# Patient Record
Sex: Male | Born: 1935 | Race: White | Hispanic: No | Marital: Married | Smoking: Former smoker
Health system: Southern US, Community
[De-identification: ages and names within clinical notes are randomized; demographics above are authoritative.]

## PROBLEM LIST (undated history)

## (undated) DIAGNOSIS — K648 Other hemorrhoids: Secondary | ICD-10-CM

## (undated) DIAGNOSIS — I4891 Unspecified atrial fibrillation: Secondary | ICD-10-CM

## (undated) DIAGNOSIS — D696 Thrombocytopenia, unspecified: Secondary | ICD-10-CM

## (undated) DIAGNOSIS — J449 Chronic obstructive pulmonary disease, unspecified: Secondary | ICD-10-CM

## (undated) DIAGNOSIS — N183 Chronic kidney disease, stage 3 unspecified: Secondary | ICD-10-CM

## (undated) DIAGNOSIS — N289 Disorder of kidney and ureter, unspecified: Secondary | ICD-10-CM

## (undated) DIAGNOSIS — K219 Gastro-esophageal reflux disease without esophagitis: Secondary | ICD-10-CM

## (undated) DIAGNOSIS — K222 Esophageal obstruction: Secondary | ICD-10-CM

## (undated) DIAGNOSIS — K709 Alcoholic liver disease, unspecified: Secondary | ICD-10-CM

## (undated) DIAGNOSIS — Z951 Presence of aortocoronary bypass graft: Secondary | ICD-10-CM

## (undated) DIAGNOSIS — I2510 Atherosclerotic heart disease of native coronary artery without angina pectoris: Secondary | ICD-10-CM

## (undated) DIAGNOSIS — K7200 Acute and subacute hepatic failure without coma: Secondary | ICD-10-CM

## (undated) DIAGNOSIS — N400 Enlarged prostate without lower urinary tract symptoms: Secondary | ICD-10-CM

## (undated) DIAGNOSIS — R188 Other ascites: Secondary | ICD-10-CM

## (undated) DIAGNOSIS — E039 Hypothyroidism, unspecified: Secondary | ICD-10-CM

## (undated) DIAGNOSIS — I10 Essential (primary) hypertension: Secondary | ICD-10-CM

## (undated) DIAGNOSIS — R944 Abnormal results of kidney function studies: Secondary | ICD-10-CM

## (undated) HISTORY — DX: Chronic kidney disease, stage 3 unspecified: N18.30

## (undated) HISTORY — DX: Chronic kidney disease, stage 3 (moderate): N18.3

## (undated) HISTORY — PX: BACK SURGERY: SHX140

## (undated) HISTORY — DX: Hypothyroidism, unspecified: E03.9

## (undated) HISTORY — PX: APPENDECTOMY: SHX54

## (undated) HISTORY — PX: CARDIAC SURGERY: SHX584

## (undated) HISTORY — DX: Gastro-esophageal reflux disease without esophagitis: K21.9

## (undated) HISTORY — DX: Other hemorrhoids: K64.8

## (undated) HISTORY — DX: Atherosclerotic heart disease of native coronary artery without angina pectoris: I25.10

## (undated) HISTORY — DX: Esophageal obstruction: K22.2

## (undated) HISTORY — DX: Abnormal results of kidney function studies: R94.4

## (undated) HISTORY — DX: Other ascites: R18.8

## (undated) HISTORY — DX: Benign prostatic hyperplasia without lower urinary tract symptoms: N40.0

---

## 2002-01-24 ENCOUNTER — Encounter: Admission: RE | Admit: 2002-01-24 | Discharge: 2002-01-24 | Payer: Self-pay | Admitting: Orthopedic Surgery

## 2002-01-24 ENCOUNTER — Encounter: Payer: Self-pay | Admitting: Orthopedic Surgery

## 2002-02-03 ENCOUNTER — Encounter: Admission: RE | Admit: 2002-02-03 | Discharge: 2002-02-03 | Payer: Self-pay | Admitting: Orthopedic Surgery

## 2002-02-03 ENCOUNTER — Encounter: Payer: Self-pay | Admitting: Orthopedic Surgery

## 2002-02-17 ENCOUNTER — Encounter: Payer: Self-pay | Admitting: Orthopedic Surgery

## 2002-02-17 ENCOUNTER — Encounter: Admission: RE | Admit: 2002-02-17 | Discharge: 2002-02-17 | Payer: Self-pay | Admitting: Orthopedic Surgery

## 2012-02-22 ENCOUNTER — Emergency Department (HOSPITAL_COMMUNITY): Payer: Medicare Other

## 2012-02-22 ENCOUNTER — Encounter (HOSPITAL_COMMUNITY): Payer: Self-pay | Admitting: Emergency Medicine

## 2012-02-22 ENCOUNTER — Emergency Department (HOSPITAL_COMMUNITY)
Admission: EM | Admit: 2012-02-22 | Discharge: 2012-02-23 | Disposition: A | Discharge status: Home / Self Care | Payer: Medicare Other | Attending: Emergency Medicine | Admitting: Emergency Medicine

## 2012-02-22 DIAGNOSIS — J449 Chronic obstructive pulmonary disease, unspecified: Secondary | ICD-10-CM | POA: Insufficient documentation

## 2012-02-22 DIAGNOSIS — I10 Essential (primary) hypertension: Secondary | ICD-10-CM | POA: Insufficient documentation

## 2012-02-22 DIAGNOSIS — W108XXA Fall (on) (from) other stairs and steps, initial encounter: Secondary | ICD-10-CM | POA: Insufficient documentation

## 2012-02-22 DIAGNOSIS — S2249XA Multiple fractures of ribs, unspecified side, initial encounter for closed fracture: Secondary | ICD-10-CM | POA: Insufficient documentation

## 2012-02-22 DIAGNOSIS — J4489 Other specified chronic obstructive pulmonary disease: Secondary | ICD-10-CM | POA: Insufficient documentation

## 2012-02-22 DIAGNOSIS — Y92009 Unspecified place in unspecified non-institutional (private) residence as the place of occurrence of the external cause: Secondary | ICD-10-CM | POA: Insufficient documentation

## 2012-02-22 DIAGNOSIS — Z951 Presence of aortocoronary bypass graft: Secondary | ICD-10-CM | POA: Insufficient documentation

## 2012-02-22 DIAGNOSIS — N289 Disorder of kidney and ureter, unspecified: Secondary | ICD-10-CM | POA: Insufficient documentation

## 2012-02-22 DIAGNOSIS — I4891 Unspecified atrial fibrillation: Secondary | ICD-10-CM | POA: Insufficient documentation

## 2012-02-22 DIAGNOSIS — I2510 Atherosclerotic heart disease of native coronary artery without angina pectoris: Secondary | ICD-10-CM | POA: Insufficient documentation

## 2012-02-22 HISTORY — DX: Essential (primary) hypertension: I10

## 2012-02-22 HISTORY — DX: Presence of aortocoronary bypass graft: Z95.1

## 2012-02-22 HISTORY — DX: Unspecified atrial fibrillation: I48.91

## 2012-02-22 HISTORY — DX: Chronic obstructive pulmonary disease, unspecified: J44.9

## 2012-02-22 HISTORY — DX: Disorder of kidney and ureter, unspecified: N28.9

## 2012-02-22 HISTORY — DX: Atherosclerotic heart disease of native coronary artery without angina pectoris: I25.10

## 2012-02-22 MED ORDER — HYDROCODONE-ACETAMINOPHEN 5-325 MG PO TABS: 1.0000 | ORAL_TABLET | ORAL | Status: AC | PRN

## 2012-02-22 MED ORDER — HYDROCODONE-ACETAMINOPHEN 5-325 MG PO TABS
1.0000 | ORAL_TABLET | Freq: Once | ORAL | Status: AC
  Administered 2012-02-22: 1 via ORAL
  Filled 2012-02-22: qty 1

## 2012-02-22 NOTE — ED Notes (Signed)
WUJ:[id number]<JY> Expected date:<BR> Expected time:<BR> Means of arrival:<BR> Comments:<BR> No bed

## 2012-02-22 NOTE — ED Notes (Signed)
Pt alert, arrives from home, c/o fall from standing, onset was this evening, states was helping wife up stairs ,fell, landing into car, c/o left rib pain, right knee pain, pt has abrasions to both knees, DSD intact

## 2012-02-22 NOTE — ED Provider Notes (Signed)
History     CSN: [id number]  Arrival date & time 02/22/12  2006   First MD Initiated Contact with Patient 02/22/12 2258      Chief Complaint  Patient presents with  . Fall  . Rib Injury   HPI  History provided by the patient and daughter. Patient is a 79 year old male with history of hypertension, COPD, CAD and A. Fib. Who presents with left-sided chest wall pain after a fall. Patient states that he and family were just returning from visiting [place] and were getting into the house. Patient was helping his wife 3 porch steps when she was losing her balance. Patient states that she has history of falls in the past and he was trying to brace her and keep her standing. They ended up stumbling back wards several steps and he states they both ended up falling. As they did patient hit the left side of his chest wall and back against the car in the driveway. He denies significant head injury or any LOC. Since that time he reports having left-sided chest wall pains. Pain is worse with deep breathing or some movements. He has not taken anything for his symptoms. He denies any pain to the extremities or back. Denies any numbness or weakness he denies feeling short of breath.    Past Medical History  Diagnosis Date  . S/P CABG x 5   . A-fib   . COPD (chronic obstructive pulmonary disease)   . Coronary artery disease   . Hypertension   . Renal disorder     Past Surgical History  Procedure Date  . Back surgery   . Cardiac surgery     No family history on file.  History  Substance Use Topics  . Smoking status: Never Smoker   . Smokeless tobacco: Not on file  . Alcohol Use: No      Review of Systems  HENT: Negative for neck pain.   Respiratory: Negative for shortness of breath.   Cardiovascular: Positive for chest pain.  Gastrointestinal: Negative for nausea, vomiting and abdominal pain.  Musculoskeletal: Negative for back pain.  Neurological: Negative for light-headedness  and headaches.    Allergies  Review of patient's allergies indicates no known allergies.  Home Medications  No current outpatient prescriptions on file.  BP 129/51  Pulse 60  Temp 98 F (36.7 C)  Resp 16  Wt 170 lb (77.111 kg)  SpO2 98%  Physical Exam  Nursing note and vitals reviewed. Constitutional: He is oriented to person, place, and time. He appears well-developed and well-nourished. No distress.  HENT:  Head: Normocephalic and atraumatic.       No battle sign or raccoon eyes  Neck: Normal range of motion. Neck supple.       No cervical midline tenderness  Cardiovascular: Normal rate.  An irregular rhythm present.  Pulmonary/Chest: Effort normal. He has no decreased breath sounds. He has no wheezes. He has no rhonchi. He has rales in the left lower field.    Abdominal: Soft. There is no tenderness. There is no rebound and no guarding.  Musculoskeletal: Normal range of motion. He exhibits no edema and no tenderness.  Neurological: He is alert and oriented to person, place, and time. He has normal strength. No sensory deficit. Gait normal.  Skin: Skin is warm. No erythema.  Psychiatric: His behavior is normal.    ED Course  Procedures   Dg Ribs Unilateral W/chest Left  02/22/2012  *RADIOLOGY REPORT*  Clinical Data: Fall.  Left rib pain.  LEFT RIBS AND CHEST - 3+ VIEW  Comparison: None.  Findings: Subsegmental atelectasis and a small left effusion are noted.  Right lung is clear.  There is cardiomegaly.  The patient status post CABG.  No pneumothorax.  There are fractures on the left eighth, ninth and tenth ribs which appear nondisplaced.  IMPRESSION: Nondisplaced left eighth through tenth rib fractures. Tiny left pleural effusion also noted.  Original Report Authenticated By: Bernadene Bell. D'ALESSIO, M.D.     1. Multiple rib fractures       MDM  10:50PM patient seen and evaluated. Patient no acute distress but uncomfortable.   Discuss the patient's findings on  x-ray and diagnosis. At this time patient requesting to return home. We'll provide prescription for pain medicines and incentive spirometer.  Pt discussed with Attending Physician.     Angus Seller, [place] 02/22/12 [phone]

## 2012-02-22 NOTE — ED Notes (Signed)
Pt states that he was helping his wife up the concrete stairs outside when she lost her balance. He fell trying to keep her from falling. Pt says he hit the car with his side before hitting the concrete. Pt has pain on left flank and left shoulder.

## 2012-02-23 NOTE — ED Provider Notes (Signed)
Medical screening examination/treatment/procedure(s) were performed by non-physician practitioner and as supervising physician I was immediately available for consultation/collaboration.  [name], MD 02/23/12 [phone]

## 2014-12-12 ENCOUNTER — Telehealth: Payer: Self-pay | Admitting: Cardiology

## 2014-12-12 NOTE — Telephone Encounter (Signed)
Patient signed Release to obtain records [facility]ocating to this area.  Faxed signed release to [facility].  Faxed 12/12/14.

## 2014-12-14 ENCOUNTER — Telehealth: Payer: Self-pay | Admitting: Cardiology

## 2014-12-14 NOTE — Telephone Encounter (Signed)
Received records that Dr Antoine Poche requested from [facility] [place].  Records give to Dr Antoine Poche for review.

## 2014-12-26 ENCOUNTER — Telehealth: Payer: Self-pay | Admitting: Cardiology

## 2014-12-26 NOTE — Telephone Encounter (Signed)
Received records from [facility] of [place] for appointment on 03/29/15.  Records given to [facility] (medical records) for Dr Hochrein's schedule on 03/29/15. lp

## 2015-03-29 ENCOUNTER — Ambulatory Visit (INDEPENDENT_AMBULATORY_CARE_PROVIDER_SITE_OTHER): Payer: Medicare Other | Admitting: Cardiology

## 2015-03-29 ENCOUNTER — Encounter: Payer: Self-pay | Admitting: Cardiology

## 2015-03-29 VITALS — BP 132/80 | HR 61 | Ht 69.0 in | Wt 173.7 lb

## 2015-03-29 DIAGNOSIS — I482 Chronic atrial fibrillation, unspecified: Secondary | ICD-10-CM | POA: Insufficient documentation

## 2015-03-29 DIAGNOSIS — I2510 Atherosclerotic heart disease of native coronary artery without angina pectoris: Secondary | ICD-10-CM | POA: Diagnosis not present

## 2015-03-29 NOTE — Progress Notes (Signed)
Cardiology Office Note   Date:  03/29/2015   ID:  CORRIE Taylor, DOB 1930-04-03, MRN [id number]  PCP:  Ginette Otto, MD  Cardiologist:   Rollene Rotunda, MD   Chief Complaint  Patient presents with  . Coronary Artery Disease      History of Present Illness: Xavier Taylor is a 81 y.o. male who presents for evaluation of coronary artery disease. He has moved up here from [place]. He had bypass actually at [facility] many years ago when he was visiting his daughter. At that time he was living in [place]. He has now moved here and is transferring his cardiology care. He has a history of bypass with cardiac catheterization last in 2002 as described below. His last stress test was in 2009 he had a negative perfusion study. Echocardiography in 2014 demonstrated a preserved ejection fraction. He has had atrial fibrillation with previous cardioversion but he has apparently been in chronic atrial fibrillation for a couple of years. He tolerates anticoagulation.  The patient lives at a nursing home with his wife. He walks to the dining hall which is about a football field distance away. He denies any cardiovascular symptoms. He has no chest pressure, neck or arm discomfort. He denies any shortness of breath, PND or orthopnea. He has no palpitations, presyncope or syncope. His biggest complaint has been neuropathy.  Past Medical History  Diagnosis Date  . S/P CABG x 5   . A-fib   . COPD (chronic obstructive pulmonary disease)   . Coronary artery disease   . Hypertension   . Renal disorder     Past Surgical History  Procedure Laterality Date  . Back surgery    . Cardiac surgery    . Appendectomy       Current Outpatient Prescriptions  Medication Sig Dispense Refill  . amLODipine (NORVASC) 10 MG tablet Take 10 mg by mouth daily.    [name] apixaban (ELIQUIS) 5 MG TABS tablet Take 2.5 mg by mouth 2 (two) times daily.     . carvedilol (COREG) 12.5 MG tablet Take 12.5 mg by mouth 2  (two) times daily with a meal.    . donepezil (ARICEPT ODT) 10 MG disintegrating tablet Take 10 mg by mouth at bedtime.    . fluticasone (FLONASE) 50 MCG/ACT nasal spray Place 2 sprays into the nose daily.    . furosemide (LASIX) 40 MG tablet Take 40 mg by mouth daily.    [name] levothyroxine (SYNTHROID, LEVOTHROID) 50 MCG tablet Take 50 mcg by mouth daily.    [name] omeprazole (PRILOSEC) 20 MG capsule Take 20 mg by mouth daily.     [name] oxybutynin (DITROPAN) 5 MG tablet Take 5 mg by mouth daily.     . simvastatin (ZOCOR) 10 MG tablet Take 10 mg by mouth daily.    . Tamsulosin HCl (FLOMAX) 0.4 MG CAPS Take 0.4 mg by mouth daily.    [name] Umeclidinium Bromide (INCRUSE ELLIPTA) 62.5 MCG/INH AEPB Inhale 1 Units into the lungs daily.     No current facility-administered medications for this visit.    Allergies:   Review of patient's allergies indicates no known allergies.    Social History:  The patient  reports that he has quit smoking. He does not have any smokeless tobacco history on file. He reports that he does not drink alcohol.   Family History:  The patient's family history includes Breast cancer in his daughter; Diabetes in his son and son; Heart attack (age of  onset: 75) in his son; Heart attack (age of onset: 49) in his father.    ROS:  Please see the history of present illness.   Otherwise, review of systems are positive for memory problem.   All other systems are reviewed and negative.    PHYSICAL EXAM: VS:  BP 132/80 mmHg  Pulse 61  Ht  (1.753 m)  Wt 173 lb 11.2 oz (78.79 kg)  BMI 25.64 kg/m2 , BMI Body mass index is 25.64 kg/(m^2). GENERAL:  Well appearing HEENT:  Pupils equal round and reactive, fundi not visualized, oral mucosa unremarkable NECK:  No jugular venous distention, waveform within normal limits, carotid upstroke brisk and symmetric, no bruits, no thyromegaly LYMPHATICS:  No cervical, inguinal adenopathy LUNGS:  Clear to auscultation bilaterally BACK:  No CVA  tenderness CHEST:  Well healed sternotomy scar HEART:  PMI not displaced or sustained,S1 and S2 within normal limits, no S3, no S4, no clicks, no rubs, no murmurs ABD:  Flat, positive bowel sounds normal in frequency in pitch, no bruits, no rebound, no guarding, no midline pulsatile mass, no hepatomegaly, no splenomegaly EXT:  2 plus pulses throughout, trace edema, no cyanosis no clubbing SKIN:  No rashes no nodules NEURO:  Cranial nerves II through XII grossly intact, motor grossly intact throughout PSYCH:  Cognitively intact, oriented to person place and time    EKG:  EKG is ordered today. The ekg ordered today demonstrates atrial fibrillation, rate 61, right bundle branch block, left anterior fascicular block, no acute ST-T wave changes.   Recent Labs: No results found for requested labs within last 365 days.    Lipid Panel No results found for: CHOL, TRIG, HDL, CHOLHDL, VLDL, LDLCALC, LDLDIRECT    Wt Readings from Last 3 Encounters:  03/29/15 173 lb 11.2 oz (78.79 kg)  02/22/12 170 lb (77.111 kg)      Other studies Reviewed: Additional studies/ records that were reviewed today include: Previous cardiology office records from [place], primary care office records. Review of the above records demonstrates:  Please see elsewhere in the note.     ASSESSMENT AND PLAN:  CAD: The patient has had no new symptoms. She has good functional level for his age. At this point I don't think screening stress testing is indicated. We will continue to manage him conservatively.  ATRIAL FIB:  The patient tolerates anticoagulation but apparently has good rate control. Of note he would technically qualify for the 5 mg twice a day Eliquis dose but given some easy bruisability I will continue with the lower dose.  HTN:  His blood pressure is well-controlled. He will continue the meds as listed.   Current medicines are reviewed at length with the patient today.  The patient does not have  concerns regarding medicines.  The following changes have been made:  no change  Labs/ tests ordered today include:   Orders Placed This Encounter  Procedures  . EKG 12-Lead     Disposition:   FU with me in six months.      Signed, Rollene Rotunda, MD  03/29/2015 1:21 PM    [facility]

## 2015-03-29 NOTE — Patient Instructions (Signed)
Your physician wants you to follow-up in: 6 MONTHS WITH DR. HOCHREIN. You will receive a reminder letter in the mail two months in advance. If you don't receive a letter, please call our office to schedule the follow-up appointment.  

## 2015-08-10 ENCOUNTER — Other Ambulatory Visit: Payer: Self-pay | Admitting: Nurse Practitioner

## 2015-08-10 ENCOUNTER — Ambulatory Visit
Admission: RE | Admit: 2015-08-10 | Discharge: 2015-08-10 | Disposition: A | Discharge status: Home / Self Care | Payer: Medicare Other | Source: Ambulatory Visit | Attending: Nurse Practitioner | Admitting: Nurse Practitioner

## 2015-08-10 DIAGNOSIS — R066 Hiccough: Secondary | ICD-10-CM

## 2015-09-07 ENCOUNTER — Other Ambulatory Visit: Payer: Self-pay | Admitting: Geriatric Medicine

## 2015-09-07 ENCOUNTER — Ambulatory Visit
Admission: RE | Admit: 2015-09-07 | Discharge: 2015-09-07 | Disposition: A | Discharge status: Home / Self Care | Payer: Medicare Other | Source: Ambulatory Visit | Attending: Geriatric Medicine | Admitting: Geriatric Medicine

## 2015-09-07 DIAGNOSIS — M542 Cervicalgia: Secondary | ICD-10-CM

## 2015-11-20 ENCOUNTER — Ambulatory Visit
Admission: RE | Admit: 2015-11-20 | Discharge: 2015-11-20 | Disposition: A | Discharge status: Home / Self Care | Payer: Medicare Other | Source: Ambulatory Visit | Attending: Geriatric Medicine | Admitting: Geriatric Medicine

## 2015-11-20 ENCOUNTER — Other Ambulatory Visit: Payer: Self-pay | Admitting: Geriatric Medicine

## 2015-11-20 DIAGNOSIS — J9811 Atelectasis: Secondary | ICD-10-CM

## 2015-12-11 ENCOUNTER — Ambulatory Visit
Admission: RE | Admit: 2015-12-11 | Discharge: 2015-12-11 | Disposition: A | Discharge status: Home / Self Care | Payer: Medicare Other | Source: Ambulatory Visit | Attending: Geriatric Medicine | Admitting: Geriatric Medicine

## 2015-12-11 ENCOUNTER — Other Ambulatory Visit: Payer: Self-pay | Admitting: Geriatric Medicine

## 2015-12-11 DIAGNOSIS — J8410 Pulmonary fibrosis, unspecified: Secondary | ICD-10-CM

## 2016-01-22 ENCOUNTER — Encounter: Payer: Self-pay | Admitting: *Deleted

## 2016-01-23 ENCOUNTER — Encounter: Payer: Self-pay | Admitting: Internal Medicine

## 2016-01-23 ENCOUNTER — Other Ambulatory Visit (INDEPENDENT_AMBULATORY_CARE_PROVIDER_SITE_OTHER): Payer: Medicare Other

## 2016-01-23 ENCOUNTER — Ambulatory Visit (INDEPENDENT_AMBULATORY_CARE_PROVIDER_SITE_OTHER): Payer: Medicare Other | Admitting: Internal Medicine

## 2016-01-23 VITALS — BP 126/60 | HR 76 | Ht 68.5 in | Wt 166.4 lb

## 2016-01-23 DIAGNOSIS — R0600 Dyspnea, unspecified: Secondary | ICD-10-CM

## 2016-01-23 DIAGNOSIS — J61 Pneumoconiosis due to asbestos and other mineral fibers: Secondary | ICD-10-CM

## 2016-01-23 DIAGNOSIS — R0602 Shortness of breath: Secondary | ICD-10-CM | POA: Insufficient documentation

## 2016-01-23 LAB — BASIC METABOLIC PANEL
BUN: 22 mg/dL (ref 6–23)
CALCIUM: 9.3 mg/dL (ref 8.4–10.5)
CO2: 29 mEq/L (ref 19–32)
Chloride: 97 mEq/L (ref 96–112)
Creatinine, Ser: 1.55 mg/dL — ABNORMAL HIGH (ref 0.40–1.50)
GFR: 45.63 mL/min — AB (ref >60.00)
GLUCOSE: 108 mg/dL — AB (ref 70–99)
Potassium: 4.5 mEq/L (ref 3.5–5.1)
SODIUM: 136 meq/L (ref 135–145)

## 2016-01-23 LAB — CBC WITH DIFFERENTIAL/PLATELET
BASOS ABS: 0 10*3/uL (ref 0.0–0.1)
Basophils Relative: 0.6 % (ref 0.0–3.0)
EOS ABS: 0.2 10*3/uL (ref 0.0–0.7)
Eosinophils Relative: 3.4 % (ref 0.0–5.0)
HCT: 32.5 % — ABNORMAL LOW (ref 39.0–52.0)
Hemoglobin: 10.9 g/dL — ABNORMAL LOW (ref 13.0–17.0)
LYMPHS ABS: 1.9 10*3/uL (ref 0.7–4.0)
Lymphocytes Relative: 26.5 % (ref 12.0–46.0)
MCHC: 33.6 g/dL (ref 30.0–36.0)
MCV: 98 fl (ref 78.0–100.0)
Monocytes Absolute: 0.7 10*3/uL (ref 0.1–1.0)
Monocytes Relative: 9.9 % (ref 3.0–12.0)
NEUTROS ABS: 4.2 10*3/uL (ref 1.4–7.7)
NEUTROS PCT: 59.6 % (ref 43.0–77.0)
PLATELETS: 203 10*3/uL (ref 150.0–400.0)
RBC: 3.32 Mil/uL — ABNORMAL LOW (ref 4.22–5.81)
RDW: 14.4 % (ref 11.5–15.5)
WBC: 7.1 10*3/uL (ref 4.0–10.5)

## 2016-01-23 LAB — TSH: TSH: 3.39 u[IU]/mL (ref 0.35–4.50)

## 2016-01-23 LAB — BRAIN NATRIURETIC PEPTIDE: PRO B NATRI PEPTIDE: 650 pg/mL — AB (ref 0.0–100.0)

## 2016-01-23 NOTE — Progress Notes (Signed)
Subjective:    Patient ID: Xavier Taylor, male    DOB: 1929/08/16,    MRN: [id number]  HPI  74 yowm last asbestos exp in mid 1950s in [place] and last smoker in 1995 at CABG and very sedentary but until around 2012  Not limited by breathing from desired activities then gradual doe to point where referred to pulmonary clinic 01/23/2016 by Dr Pete Glatter with CT chest 12/11/15     01/23/2016 1st [place] office visit/ [name]  On spiriva but not helping  Chief Complaint  Patient presents with  . Pulmonary Consult    Referred by Dr. Merlene Laughter for eval of abnormal ct chest.  Pt c/o SOB "for a long time"- mostly with exertion but occ at rest. He gets winded walking from room to room at home.    indolent onset x 5 y doe to point of doe x across the appt at [facility] and walk from appt to elevator which he thinks is about 300 ft total but hips slow him down as much as his sob and no better since rx with spiriva  No obvious  day to day or daytime variabilty or assoc chronic cough or cp or chest tightness, subjective wheeze overt  hb symptoms though is bothered by intermittent watery rhinorrhea x several years. No unusual exp hx or h/o childhood pna/ asthma or knowledge of premature birth.  Sleeping ok without nocturnal  or early am exacerbation  of respiratory  c/o's or need for noct saba. Also denies any obvious fluctuation of symptoms with weather or environmental changes or other aggravating or alleviating factors except as outlined above   Current Medications, Allergies, Complete Past Medical History, Past Surgical History, Family History, and Social History were reviewed in [employer] record.                Review of Systems  Constitutional: Negative for fever, chills, activity change, appetite change and unexpected weight change.  HENT: Positive for rhinorrhea. Negative for congestion, dental problem, postnasal drip, sneezing, sore throat, trouble  swallowing and voice change.   Eyes: Negative for visual disturbance.  Respiratory: Positive for shortness of breath. Negative for cough and choking.   Cardiovascular: Negative for chest pain and leg swelling.  Gastrointestinal: Negative for nausea, vomiting and abdominal pain.  Genitourinary: Negative for difficulty urinating.  Musculoskeletal: Negative for arthralgias.  Skin: Negative for rash.  Psychiatric/Behavioral: Negative for behavioral problems and confusion.       Objective:   Physical Exam  Stoic amb wm prefers to let his wife descibe his symptoms   Wt Readings from Last 3 Encounters:  01/23/16 166 lb 6.4 oz (75.479 kg)  03/29/15 173 lb 11.2 oz (78.79 kg)  02/22/12 170 lb (77.111 kg)    Vital signs reviewed   HEENT: nl dentition, turbinates, and oropharynx. Nl external ear canals without cough reflex   NECK :  without JVD/Nodes/TM/ nl carotid upstrokes bilaterally   LUNGS: no acc muscle use,  Nl contour chest with minimal insp crackles both bases   CV:  RRR  no s3 or murmur or increase in P2, no edema   ABD:  soft and nontender with nl inspiratory excursion in the supine position. No bruits or organomegaly, bowel sounds nl  MS:  Nl gait/ ext warm without deformities, calf tenderness, cyanosis or clubbing No obvious joint restrictions   SKIN: warm and dry without lesions    NEURO:  alert, approp, nl sensorium with  no motor deficits      I personally reviewed images and agree with radiology impression as follows:  HRCT Chest   12/11/15 1. Small calcified pleural plaques throughout the thorax bilaterally, suggestive of asbestos related pleural disease. 2. Patchy areas of ground-glass attenuation and septal thickening which are most evident throughout the mid to lower lungs bilaterally. 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 Strictly speaking, early usual interstitial pneumonia (UIP) as a manifestation of asbestos chest is not entirely excluded, particularly given the craniocaudal gradient seen in this patient.     Labs ordered/ reviewed:      Chemistry      Component Value Date/Time   NA 136 01/23/2016 1521   K 4.5 01/23/2016 1521   CL 97 01/23/2016 1521   CO2 29 01/23/2016 1521   BUN 22 01/23/2016 1521   CREATININE 1.55* 01/23/2016 1521      Component Value Date/Time   CALCIUM 9.3 01/23/2016 1521        Lab Results  Component Value Date   WBC 7.1 01/23/2016   HGB 10.9* 01/23/2016   HCT 32.5* 01/23/2016   MCV 98.0 01/23/2016   PLT 203.0 01/23/2016        Lab Results  Component Value Date   TSH 3.39 01/23/2016     Lab Results  Component Value Date   PROBNP 650.0* 01/23/2016             Assessment & Plan:

## 2016-01-23 NOTE — Patient Instructions (Addendum)
Stop the spiriva since you don't think it's helping   Please remember to go to the lab  department downstairs for your tests - we will call you with the results when they are available.  Please schedule a follow up visit in 3 months but call sooner if needed with pfts / cxr on return  - late add: schedule echo

## 2016-01-24 ENCOUNTER — Other Ambulatory Visit: Payer: Self-pay | Admitting: Internal Medicine

## 2016-01-24 ENCOUNTER — Encounter: Payer: Self-pay | Admitting: Internal Medicine

## 2016-01-24 DIAGNOSIS — R0600 Dyspnea, unspecified: Secondary | ICD-10-CM

## 2016-01-24 NOTE — Progress Notes (Signed)
Quick Note:  Spoke with pt and notified of results per Dr. Wert. Pt verbalized understanding and denied any questions.  ______ 

## 2016-01-24 NOTE — Assessment & Plan Note (Signed)
Clearly he has asbestosis and will need to return for full pfts before we complete the paperwork for his VA benefits but nothing else to offer at this point for what is probably a very longstanding problem that may not actually be the cause of his worsening doe (see dyspnea a/p)

## 2016-01-24 NOTE — Assessment & Plan Note (Addendum)
01/23/2016   Walked RA  2 laps @ 185 ft each stopped due to  Sob and hips stopped at same time with sats still 96% at moderate pace - BNP  01/23/2016 =  650 > rec echo (Hochrein is his cardiologist)   As in most cases of asbestosis that I see, there are many factors contributing to symptoms - in this case he is mildly anemic, deconditioned and limited by hips and probably has a component of chf but note absence of cough assoc with ILD  This is actually good news as there's nothing to offer for asbestos dz but fill out his paperwork for the VA (see asbestos a/p) and 02 when he desaturates, which is not needed at this point.   Ok to leave off spiriva pending pfts as he doesn't think it helped.  Total time devoted to counseling  = 35/6797m review case with pt/wife discussion of options/alternatives/ personally creating written instructions  in presence of pt  then going over those specific  Instructions directly with the pt including how to use all of the meds but in particular covering each new medication in detail and the difference between the maintenance/automatic meds and the prns using an action plan format for the latter.

## 2016-02-05 ENCOUNTER — Other Ambulatory Visit (HOSPITAL_COMMUNITY): Payer: Self-pay

## 2016-02-05 ENCOUNTER — Ambulatory Visit (HOSPITAL_COMMUNITY): Payer: Medicare Other | Attending: Cardiovascular Disease

## 2016-02-05 ENCOUNTER — Ambulatory Visit (HOSPITAL_COMMUNITY): Payer: Medicare Other

## 2016-02-05 ENCOUNTER — Other Ambulatory Visit: Payer: Self-pay | Admitting: Geriatric Medicine

## 2016-02-05 ENCOUNTER — Encounter (HOSPITAL_COMMUNITY): Payer: Self-pay

## 2016-02-05 DIAGNOSIS — I119 Hypertensive heart disease without heart failure: Secondary | ICD-10-CM | POA: Diagnosis not present

## 2016-02-05 DIAGNOSIS — I2510 Atherosclerotic heart disease of native coronary artery without angina pectoris: Secondary | ICD-10-CM | POA: Insufficient documentation

## 2016-02-05 DIAGNOSIS — I071 Rheumatic tricuspid insufficiency: Secondary | ICD-10-CM | POA: Insufficient documentation

## 2016-02-05 DIAGNOSIS — R0600 Dyspnea, unspecified: Secondary | ICD-10-CM | POA: Diagnosis present

## 2016-02-05 DIAGNOSIS — I340 Nonrheumatic mitral (valve) insufficiency: Secondary | ICD-10-CM | POA: Diagnosis not present

## 2016-02-05 DIAGNOSIS — Z8249 Family history of ischemic heart disease and other diseases of the circulatory system: Secondary | ICD-10-CM | POA: Diagnosis not present

## 2016-02-05 DIAGNOSIS — J449 Chronic obstructive pulmonary disease, unspecified: Secondary | ICD-10-CM | POA: Diagnosis not present

## 2016-02-05 DIAGNOSIS — Z87891 Personal history of nicotine dependence: Secondary | ICD-10-CM | POA: Insufficient documentation

## 2016-02-05 DIAGNOSIS — R0602 Shortness of breath: Secondary | ICD-10-CM

## 2016-03-13 ENCOUNTER — Encounter (HOSPITAL_COMMUNITY): Payer: Self-pay

## 2016-03-13 ENCOUNTER — Emergency Department (HOSPITAL_COMMUNITY)
Admission: EM | Admit: 2016-03-13 | Discharge: 2016-03-13 | Disposition: A | Discharge status: Home / Self Care | Payer: Medicare Other | Attending: Emergency Medicine | Admitting: Emergency Medicine

## 2016-03-13 ENCOUNTER — Emergency Department (HOSPITAL_COMMUNITY): Payer: Medicare Other

## 2016-03-13 DIAGNOSIS — R55 Syncope and collapse: Secondary | ICD-10-CM | POA: Diagnosis present

## 2016-03-13 DIAGNOSIS — Z951 Presence of aortocoronary bypass graft: Secondary | ICD-10-CM | POA: Diagnosis not present

## 2016-03-13 DIAGNOSIS — E039 Hypothyroidism, unspecified: Secondary | ICD-10-CM | POA: Diagnosis not present

## 2016-03-13 DIAGNOSIS — Z87891 Personal history of nicotine dependence: Secondary | ICD-10-CM | POA: Diagnosis not present

## 2016-03-13 DIAGNOSIS — I129 Hypertensive chronic kidney disease with stage 1 through stage 4 chronic kidney disease, or unspecified chronic kidney disease: Secondary | ICD-10-CM | POA: Insufficient documentation

## 2016-03-13 DIAGNOSIS — F10120 Alcohol abuse with intoxication, uncomplicated: Secondary | ICD-10-CM | POA: Insufficient documentation

## 2016-03-13 DIAGNOSIS — Z7901 Long term (current) use of anticoagulants: Secondary | ICD-10-CM | POA: Insufficient documentation

## 2016-03-13 DIAGNOSIS — Z79899 Other long term (current) drug therapy: Secondary | ICD-10-CM | POA: Insufficient documentation

## 2016-03-13 DIAGNOSIS — J449 Chronic obstructive pulmonary disease, unspecified: Secondary | ICD-10-CM | POA: Insufficient documentation

## 2016-03-13 DIAGNOSIS — I2510 Atherosclerotic heart disease of native coronary artery without angina pectoris: Secondary | ICD-10-CM | POA: Insufficient documentation

## 2016-03-13 DIAGNOSIS — F10920 Alcohol use, unspecified with intoxication, uncomplicated: Secondary | ICD-10-CM

## 2016-03-13 DIAGNOSIS — N183 Chronic kidney disease, stage 3 (moderate): Secondary | ICD-10-CM | POA: Insufficient documentation

## 2016-03-13 LAB — CBC WITH DIFFERENTIAL/PLATELET
BASOS PCT: 0 %
Basophils Absolute: 0 10*3/uL (ref 0.0–0.1)
EOS ABS: 0.1 10*3/uL (ref 0.0–0.7)
EOS PCT: 3 %
HCT: 29.8 % — ABNORMAL LOW (ref 39.0–52.0)
HEMOGLOBIN: 9.9 g/dL — AB (ref 13.0–17.0)
LYMPHS ABS: 1.8 10*3/uL (ref 0.7–4.0)
Lymphocytes Relative: 35 %
MCH: 32.4 pg (ref 26.0–34.0)
MCHC: 33.2 g/dL (ref 30.0–36.0)
MCV: 97.4 fL (ref 78.0–100.0)
MONOS PCT: 13 %
Monocytes Absolute: 0.7 10*3/uL (ref 0.1–1.0)
NEUTROS PCT: 49 %
Neutro Abs: 2.6 10*3/uL (ref 1.7–7.7)
PLATELETS: 148 10*3/uL — AB (ref 150–400)
RBC: 3.06 MIL/uL — ABNORMAL LOW (ref 4.22–5.81)
RDW: 14.6 % (ref 11.5–15.5)
WBC: 5.3 10*3/uL (ref 4.0–10.5)

## 2016-03-13 LAB — COMPREHENSIVE METABOLIC PANEL
ALBUMIN: 3.3 g/dL — AB (ref 3.5–5.0)
ALK PHOS: 103 U/L (ref 38–126)
ALT: 73 U/L — ABNORMAL HIGH (ref 17–63)
ANION GAP: 15 (ref 5–15)
AST: 136 U/L — ABNORMAL HIGH (ref 15–41)
BUN: 16 mg/dL (ref 6–20)
CALCIUM: 8.6 mg/dL — AB (ref 8.9–10.3)
CHLORIDE: 91 mmol/L — AB (ref 101–111)
CO2: 26 mmol/L (ref 22–32)
Creatinine, Ser: 1.66 mg/dL — ABNORMAL HIGH (ref 0.61–1.24)
GFR calc non Af Amer: 36 mL/min — ABNORMAL LOW (ref >60)
GFR, EST AFRICAN AMERICAN: 42 mL/min — AB (ref >60)
GLUCOSE: 97 mg/dL (ref 65–99)
POTASSIUM: 2.9 mmol/L — AB (ref 3.5–5.1)
SODIUM: 132 mmol/L — AB (ref 135–145)
Total Bilirubin: 2.4 mg/dL — ABNORMAL HIGH (ref 0.3–1.2)
Total Protein: 6.7 g/dL (ref 6.5–8.1)

## 2016-03-13 LAB — URINALYSIS, ROUTINE W REFLEX MICROSCOPIC
GLUCOSE, UA: NEGATIVE mg/dL
Hgb urine dipstick: NEGATIVE
KETONES UR: NEGATIVE mg/dL
LEUKOCYTES UA: NEGATIVE
NITRITE: NEGATIVE
PH: 6 (ref 5.0–8.0)
Protein, ur: NEGATIVE mg/dL
SPECIFIC GRAVITY, URINE: 1.013 (ref 1.005–1.030)

## 2016-03-13 LAB — ETHANOL: Alcohol, Ethyl (B): 193 mg/dL — ABNORMAL HIGH (ref <5)

## 2016-03-13 MED ORDER — THIAMINE HCL 100 MG/ML IJ SOLN
Freq: Once | INTRAVENOUS | Status: AC
  Administered 2016-03-13: 07:00:00 via INTRAVENOUS
  Filled 2016-03-13: qty 1000

## 2016-03-13 MED ORDER — SODIUM CHLORIDE 0.9 % IV BOLUS (SEPSIS)
500.0000 mL | Freq: Once | INTRAVENOUS | Status: AC
  Administered 2016-03-13: 500 mL via INTRAVENOUS

## 2016-03-13 MED ORDER — POTASSIUM CHLORIDE CRYS ER 20 MEQ PO TBCR
40.0000 meq | EXTENDED_RELEASE_TABLET | Freq: Once | ORAL | Status: AC
  Administered 2016-03-13: 40 meq via ORAL
  Filled 2016-03-13: qty 2

## 2016-03-13 MED ORDER — POTASSIUM CHLORIDE CRYS ER 20 MEQ PO TBCR: 20.0000 meq | EXTENDED_RELEASE_TABLET | Freq: Two times a day (BID) | ORAL | 0 refills | Status: DC

## 2016-03-13 NOTE — ED Triage Notes (Signed)
Pt comes in EMS after fall at home. PT wife reported to ems he got up to have some whiskey to help him sleep and he passed out. Pt does not remember falling. He denies hitting head  And has no visible injuries/ complaints. Pt takes eliquis. Pt was hypotensive at home initially bp 84/-. BP 140/75 at this time. NAD. A&O

## 2016-03-13 NOTE — ED Provider Notes (Signed)
[facility]-EMERGENCY DEPT Provider Note   CSN: [id number] Arrival date & time: 03/13/16  0448     History   Chief Complaint Chief Complaint  Patient presents with  . Loss of Consciousness    HPI Xavier Taylor is a 82 y.o. male.  Patient comes to ED via EMS called to family's apartment regarding syncopal episode. Per the patient's wife, he got up from bed to get some whiskey to help him sleep and was found on the floor after passing out. The patient has no memory of event. He denies pain, injury, SOB, nausea. He looked well on EMS arrival but was found to be hypotensive and transported for evaluation of hypotension and syncope. He has a history of atrial fibrillation and currently takes Eliquis. Per the patient's daughter, there is a concern for excessive alcohol use contributing to symptoms.   The history is provided by the patient. No language interpreter was used.    Past Medical History:  Diagnosis Date  . A-fib (HCC)   . Ascites   . BPH (benign prostatic hypertrophy)   . CAD (coronary artery disease)   . Chronic renal disease, stage III   . COPD (chronic obstructive pulmonary disease) (HCC)   . Coronary artery disease   . Decreased GFR   . GERD with stricture   . Hypertension   . Hypothyroid   . Internal hemorrhoids   . Renal disorder   . S/P CABG x 5     Patient Active Problem List   Diagnosis Date Noted  . Asbestosis (HCC) 01/23/2016  . Dyspnea 01/23/2016  . CAD (coronary artery disease) 03/29/2015  . Atrial fibrillation, chronic (HCC) 03/29/2015    Past Surgical History:  Procedure Laterality Date  . APPENDECTOMY    . BACK SURGERY    . CARDIAC SURGERY         Home Medications    Prior to Admission medications   Medication Sig Start Date End Date Taking? Authorizing Provider  carvedilol (COREG) 12.5 MG tablet Take 12.5 mg by mouth 2 (two) times daily with a meal.   Yes Historical Provider, MD  chlorproMAZINE (THORAZINE) 25 MG tablet Take 25 mg by  mouth 3 (three) times daily as needed.   Yes Historical Provider, MD  ELIQUIS 2.5 MG TABS tablet Take 2.5 mg by mouth daily.  02/21/16  Yes Historical Provider, MD  EXELON 9.5 MG/24HR Place 9.5 mg onto the skin daily.  03/11/16  Yes Historical Provider, MD  fluticasone (FLONASE) 50 MCG/ACT nasal spray Place 2 sprays into the nose daily.    Yes Historical Provider, MD  furosemide (LASIX) 40 MG tablet Take 40 mg by mouth daily.   Yes Historical Provider, MD  levothyroxine (SYNTHROID, LEVOTHROID) 50 MCG tablet Take 50 mcg by mouth daily.   Yes Historical Provider, MD  omeprazole (PRILOSEC) 20 MG capsule Take 20 mg by mouth daily.    Yes Historical Provider, MD  pramipexole (MIRAPEX) 0.25 MG tablet Take 0.25 mg by mouth at bedtime.   Yes Historical Provider, MD  simvastatin (ZOCOR) 10 MG tablet Take 10 mg by mouth daily.   Yes Historical Provider, MD  Tamsulosin HCl (FLOMAX) 0.4 MG CAPS Take 0.4 mg by mouth daily.   Yes Historical Provider, MD  traZODone (DESYREL) 50 MG tablet Take 25 mg by mouth at bedtime as needed for sleep. Reported on 01/23/2016   Yes Historical Provider, MD    Family History Family History  Problem Relation Age of Onset  . Heart attack  Father 6  . Diabetes Son   . Diabetes Son   . Heart attack Son 84  . Breast cancer Daughter   . Rheum arthritis Mother     Social History Social History  Substance Use Topics  . Smoking status: Former Smoker    Packs/day: 2.00    Years: 40.00    Types: Cigarettes    Quit date: 07/07/1993  . Smokeless tobacco: Former Neurosurgeon  . Alcohol use No     Allergies   Review of patient's allergies indicates no known allergies.   Review of Systems Review of Systems  Constitutional: Negative for chills, diaphoresis and fever.  HENT: Negative.   Eyes: Negative for visual disturbance.  Respiratory: Negative.  Negative for shortness of breath.   Cardiovascular: Negative.  Negative for chest pain.  Gastrointestinal: Negative.  Negative for  abdominal pain and vomiting.  Musculoskeletal: Negative.  Negative for neck pain.  Skin: Negative.  Negative for wound.  Neurological: Positive for syncope. Negative for weakness and light-headedness.     Physical Exam Updated Vital Signs BP 140/75   Pulse 70   Resp 23   Ht 5\' 8"  (1.727 m)   Wt 74.8 kg   SpO2 96%   BMI 25.09 kg/m   Physical Exam  Constitutional: He is oriented to person, place, and time. He appears well-developed and well-nourished.  HENT:  Head: Normocephalic and atraumatic.  Neck: Normal range of motion. Neck supple.  Cardiovascular: Normal rate and regular rhythm.   Pulmonary/Chest: Effort normal and breath sounds normal. He has no wheezes. He has no rales. He exhibits no tenderness.  Abdominal: Soft. Bowel sounds are normal. There is no tenderness. There is no rebound and no guarding.  Musculoskeletal: Normal range of motion.  Neurological: He is alert and oriented to person, place, and time. No cranial nerve deficit. Coordination normal.  Patient is oriented x 3 here. Coordination is without deficit. Speech is clear and focused. No facial asymmetry. CN's 3-12 grossly intact. He ambulates from EMS stretcher to bed without difficulty.  Skin: Skin is warm and dry. No rash noted.  Psychiatric: He has a normal mood and affect.     ED Treatments / Results  Labs (all labs ordered are listed, but only abnormal results are displayed) Labs Reviewed  CBC WITH DIFFERENTIAL/PLATELET - Abnormal; Notable for the following:       Result Value   RBC 3.06 (*)    Hemoglobin 9.9 (*)    HCT 29.8 (*)    Platelets 148 (*)    All other components within normal limits  COMPREHENSIVE METABOLIC PANEL  URINALYSIS, ROUTINE W REFLEX MICROSCOPIC (NOT AT [facility])  ETHANOL   Results for orders placed or performed during the hospital encounter of 03/13/16  CBC with Differential  Result Value Ref Range   WBC 5.3 4.0 - 10.5 K/uL   RBC 3.06 (L) 4.22 - 5.81 MIL/uL   Hemoglobin 9.9  (L) 13.0 - 17.0 g/dL   HCT 45.4 (L) 09.8 - 11.9 %   MCV 97.4 78.0 - 100.0 fL   MCH 32.4 26.0 - 34.0 pg   MCHC 33.2 30.0 - 36.0 g/dL   RDW 14.7 82.9 - 56.2 %   Platelets 148 (L) 150 - 400 K/uL   Neutrophils Relative % 49 %   Neutro Abs 2.6 1.7 - 7.7 K/uL   Lymphocytes Relative 35 %   Lymphs Abs 1.8 0.7 - 4.0 K/uL   Monocytes Relative 13 %   Monocytes Absolute 0.7 0.1 -  1.0 K/uL   Eosinophils Relative 3 %   Eosinophils Absolute 0.1 0.0 - 0.7 K/uL   Basophils Relative 0 %   Basophils Absolute 0.0 0.0 - 0.1 K/uL  Comprehensive metabolic panel  Result Value Ref Range   Sodium 132 (L) 135 - 145 mmol/L   Potassium 2.9 (L) 3.5 - 5.1 mmol/L   Chloride 91 (L) 101 - 111 mmol/L   CO2 26 22 - 32 mmol/L   Glucose, Bld 97 65 - 99 mg/dL   BUN 16 6 - 20 mg/dL   Creatinine, Ser 9.141.66 (H) 0.61 - 1.24 mg/dL   Calcium 8.6 (L) 8.9 - 10.3 mg/dL   Total Protein 6.7 6.5 - 8.1 g/dL   Albumin 3.3 (L) 3.5 - 5.0 g/dL   AST 782136 (H) 15 - 41 U/L   ALT 73 (H) 17 - 63 U/L   Alkaline Phosphatase 103 38 - 126 U/L   Total Bilirubin 2.4 (H) 0.3 - 1.2 mg/dL   GFR calc non Af Amer 36 (L) >60 mL/min   GFR calc Af Amer 42 (L) >60 mL/min   Anion gap 15 5 - 15    EKG  EKG Interpretation None       Radiology Ct Head Wo Contrast  Result Date: 03/13/2016 CLINICAL DATA:  Status post fall EXAM: CT HEAD WITHOUT CONTRAST TECHNIQUE: Contiguous axial images were obtained from the base of the skull through the vertex without intravenous contrast. COMPARISON:  None. FINDINGS: Brain: No mass lesion, intraparenchymal hemorrhage or extra-axial collection. No evidence of acute cortical infarct. There is periventricular hypoattenuation compatible with chronic microvascular disease. There is mild cerebral atrophy. Vascular: There is atherosclerotic calcification of the vertebral arteries and proximal intracranial internal carotid arteries. Skull: Normal visualized skull base, calvarium and extracranial soft tissues.  Sinuses/Orbits: No sinus fluid levels or advanced mucosal thickening. No mastoid effusion. Normal orbits. IMPRESSION: 1. No acute intracranial abnormality. 2. Findings of chronic microvascular disease and mildly age advanced atrophy. Electronically Signed   By: Deatra RobinsonKevin  Herman M.D.   On: 03/13/2016 05:32    Procedures Procedures (including critical care time)  Medications Ordered in ED Medications  sodium chloride 0.9 % bolus 500 mL (500 mLs Intravenous New Bag/Given 03/13/16 0507)     Initial Impression / Assessment and Plan / ED Course  I have reviewed the triage vital signs and the nursing notes.  Pertinent labs & imaging results that were available during my care of the patient were reviewed by me and considered in my medical decision making (see chart for details).  Clinical Course   1. Syncope 2. Alcohol intoxication  Patient arrives for evaluation of syncopal episode while at home. He is neurologically intact, not significantly anemic, EKG largely unchanged per Dr. Wilkie AyeHorton. VSS. He is found to be intoxicated which is most likely the cause of syncopal episode. Head CT negative.   Daughter is at bedside to take patient home. He has remained alert and oriented. He is felt stable for discharge home.   Final Clinical Impressions(s) / ED Diagnoses   Final diagnoses:  None    New Prescriptions New Prescriptions   No medications on file     Elpidio AnisShari Starlena Beil, PA-C 03/14/16 [phone]    Shon Batonourtney F Horton, MD 03/14/16 [phone]

## 2016-04-20 ENCOUNTER — Emergency Department (HOSPITAL_COMMUNITY)
Admission: EM | Admit: 2016-04-20 | Discharge: 2016-04-21 | Disposition: A | Discharge status: Home / Self Care | Payer: Medicare Other | Attending: Emergency Medicine | Admitting: Emergency Medicine

## 2016-04-20 DIAGNOSIS — Y92000 Kitchen of unspecified non-institutional (private) residence as  the place of occurrence of the external cause: Secondary | ICD-10-CM | POA: Diagnosis not present

## 2016-04-20 DIAGNOSIS — Y999 Unspecified external cause status: Secondary | ICD-10-CM | POA: Diagnosis not present

## 2016-04-20 DIAGNOSIS — E871 Hypo-osmolality and hyponatremia: Secondary | ICD-10-CM

## 2016-04-20 DIAGNOSIS — Z7901 Long term (current) use of anticoagulants: Secondary | ICD-10-CM

## 2016-04-20 DIAGNOSIS — J449 Chronic obstructive pulmonary disease, unspecified: Secondary | ICD-10-CM | POA: Insufficient documentation

## 2016-04-20 DIAGNOSIS — S5001XA Contusion of right elbow, initial encounter: Secondary | ICD-10-CM | POA: Insufficient documentation

## 2016-04-20 DIAGNOSIS — Y939 Activity, unspecified: Secondary | ICD-10-CM | POA: Diagnosis not present

## 2016-04-20 DIAGNOSIS — W010XXA Fall on same level from slipping, tripping and stumbling without subsequent striking against object, initial encounter: Secondary | ICD-10-CM | POA: Diagnosis not present

## 2016-04-20 DIAGNOSIS — Z79899 Other long term (current) drug therapy: Secondary | ICD-10-CM | POA: Insufficient documentation

## 2016-04-20 DIAGNOSIS — N183 Chronic kidney disease, stage 3 (moderate): Secondary | ICD-10-CM | POA: Diagnosis not present

## 2016-04-20 DIAGNOSIS — I129 Hypertensive chronic kidney disease with stage 1 through stage 4 chronic kidney disease, or unspecified chronic kidney disease: Secondary | ICD-10-CM | POA: Diagnosis not present

## 2016-04-20 DIAGNOSIS — I2510 Atherosclerotic heart disease of native coronary artery without angina pectoris: Secondary | ICD-10-CM | POA: Diagnosis not present

## 2016-04-20 DIAGNOSIS — S59901A Unspecified injury of right elbow, initial encounter: Secondary | ICD-10-CM | POA: Diagnosis present

## 2016-04-20 DIAGNOSIS — E039 Hypothyroidism, unspecified: Secondary | ICD-10-CM | POA: Diagnosis not present

## 2016-04-20 DIAGNOSIS — Z87891 Personal history of nicotine dependence: Secondary | ICD-10-CM | POA: Insufficient documentation

## 2016-04-20 DIAGNOSIS — W19XXXA Unspecified fall, initial encounter: Secondary | ICD-10-CM

## 2016-04-20 DIAGNOSIS — S0990XA Unspecified injury of head, initial encounter: Secondary | ICD-10-CM

## 2016-04-20 LAB — BASIC METABOLIC PANEL
ANION GAP: 15 (ref 5–15)
BUN: 17 mg/dL (ref 6–20)
CHLORIDE: 83 mmol/L — AB (ref 101–111)
CO2: 22 mmol/L (ref 22–32)
Calcium: 8.2 mg/dL — ABNORMAL LOW (ref 8.9–10.3)
Creatinine, Ser: 1.7 mg/dL — ABNORMAL HIGH (ref 0.61–1.24)
GFR, EST AFRICAN AMERICAN: 41 mL/min — AB (ref >60)
GFR, EST NON AFRICAN AMERICAN: 35 mL/min — AB (ref >60)
Glucose, Bld: 104 mg/dL — ABNORMAL HIGH (ref 65–99)
POTASSIUM: 2.9 mmol/L — AB (ref 3.5–5.1)
SODIUM: 120 mmol/L — AB (ref 135–145)

## 2016-04-20 LAB — CBG MONITORING, ED: GLUCOSE-CAPILLARY: 106 mg/dL — AB (ref 65–99)

## 2016-04-20 NOTE — ED Triage Notes (Addendum)
[facility] and walked away and fell. Unsure if it was mechanical or due to dizziness. Denies head trauma. Is on eliquis. R elbow skin tears. 20G LFA. Hypotensive upon arrival. 200cc NS given en route. Drinks heavily Alert and oriented. CBG 140

## 2016-04-21 ENCOUNTER — Emergency Department (HOSPITAL_COMMUNITY): Payer: Medicare Other

## 2016-04-21 DIAGNOSIS — S5001XA Contusion of right elbow, initial encounter: Secondary | ICD-10-CM | POA: Diagnosis not present

## 2016-04-21 LAB — URINALYSIS, ROUTINE W REFLEX MICROSCOPIC
GLUCOSE, UA: NEGATIVE mg/dL
Hgb urine dipstick: NEGATIVE
Ketones, ur: NEGATIVE mg/dL
LEUKOCYTES UA: NEGATIVE
NITRITE: NEGATIVE
PROTEIN: NEGATIVE mg/dL
Specific Gravity, Urine: 1.009 (ref 1.005–1.030)
pH: 6 (ref 5.0–8.0)

## 2016-04-21 LAB — HEPATIC FUNCTION PANEL
ALK PHOS: 161 U/L — AB (ref 38–126)
ALT: 101 U/L — ABNORMAL HIGH (ref 17–63)
AST: 184 U/L — AB (ref 15–41)
Albumin: 3.2 g/dL — ABNORMAL LOW (ref 3.5–5.0)
BILIRUBIN DIRECT: 2.5 mg/dL — AB (ref 0.1–0.5)
BILIRUBIN TOTAL: 4.8 mg/dL — AB (ref 0.3–1.2)
Indirect Bilirubin: 2.3 mg/dL — ABNORMAL HIGH (ref 0.3–0.9)
Total Protein: 6.3 g/dL — ABNORMAL LOW (ref 6.5–8.1)

## 2016-04-21 LAB — CBC
HCT: 24.1 % — ABNORMAL LOW (ref 39.0–52.0)
Hemoglobin: 8.7 g/dL — ABNORMAL LOW (ref 13.0–17.0)
MCH: 33.2 pg (ref 26.0–34.0)
MCHC: 36.1 g/dL — ABNORMAL HIGH (ref 30.0–36.0)
MCV: 92 fL (ref 78.0–100.0)
PLATELETS: 105 10*3/uL — AB (ref 150–400)
RBC: 2.62 MIL/uL — AB (ref 4.22–5.81)
RDW: 14.2 % (ref 11.5–15.5)
WBC: 7.6 10*3/uL (ref 4.0–10.5)

## 2016-04-21 LAB — DIFFERENTIAL
BASOS ABS: 0 10*3/uL (ref 0.0–0.1)
Basophils Relative: 0 %
EOS ABS: 0 10*3/uL (ref 0.0–0.7)
Eosinophils Relative: 0 %
LYMPHS ABS: 1.3 10*3/uL (ref 0.7–4.0)
Lymphocytes Relative: 19 %
MONOS PCT: 7 %
Monocytes Absolute: 0.5 10*3/uL (ref 0.1–1.0)
Neutro Abs: 5.3 10*3/uL (ref 1.7–7.7)
Neutrophils Relative %: 74 %

## 2016-04-21 LAB — ETHANOL: ALCOHOL ETHYL (B): 106 mg/dL — AB (ref <5)

## 2016-04-21 MED ORDER — SODIUM CHLORIDE 0.9 % IV BOLUS (SEPSIS)
500.0000 mL | Freq: Once | INTRAVENOUS | Status: AC
  Administered 2016-04-21: 500 mL via INTRAVENOUS

## 2016-04-21 NOTE — Discharge Instructions (Addendum)
Follow-up with your primary Dr. in the next 3 days for a recheck of your liver functions, sodium, and hemoglobin.  Limit your alcohol intake.  Return to the emergency department if your symptoms significantly worsen or change.

## 2016-04-21 NOTE — ED Provider Notes (Signed)
[facility]-EMERGENCY DEPT Provider Note   CSN: [id number] Arrival date & time: 04/20/16  2306  By signing my name below, I, Xavier Taylor, attest that this documentation has been prepared under the direction and in the presence of Xavier Lyons, MD . Electronically Signed: Majel Taylor, Scribe. 04/21/2016. 12:45 AM.  History   Chief Complaint Chief Complaint  Patient presents with  . Fall   The history is provided by the patient and a relative. No language interpreter was used.   HPI Comments: Xavier Taylor is a 82 y.o. male brought in by EMS to the Emergency Department from [facility] for an evaluation s/p a fall that occurred this evening. Pt reports he was standing at the kitchen counter when he suddenly became "dizzy" and fell onto his right elbow. Pt's daughter reports his fall was witnessed by her nephew who confirmed pt did not lose consciousness; pt denies any head injury. He notes associated abdominal pain that began "a while ago" and decreased appetite due to not being able to keep solid foods down. Per daughter, pt drinks at least 1 glass of EtOH daily at night and believes he may be dehydrated. He denies pain to any area. Pt notes he is currently taking Eliquis.   Past Medical History:  Diagnosis Date  . A-fib (HCC)   . Ascites   . BPH (benign prostatic hypertrophy)   . CAD (coronary artery disease)   . Chronic renal disease, stage III   . COPD (chronic obstructive pulmonary disease) (HCC)   . Coronary artery disease   . Decreased GFR   . GERD with stricture   . Hypertension   . Hypothyroid   . Internal hemorrhoids   . Renal disorder   . S/P CABG x 5     Patient Active Problem List   Diagnosis Date Noted  . Asbestosis (HCC) 01/23/2016  . Dyspnea 01/23/2016  . CAD (coronary artery disease) 03/29/2015  . Atrial fibrillation, chronic (HCC) 03/29/2015    Past Surgical History:  Procedure Laterality Date  . APPENDECTOMY    . BACK SURGERY    .  CARDIAC SURGERY      Home Medications    Prior to Admission medications   Medication Sig Start Date End Date Taking? Authorizing Provider  carvedilol (COREG) 12.5 MG tablet Take 12.5 mg by mouth 2 (two) times daily with a meal.    Historical Provider, MD  chlorproMAZINE (THORAZINE) 25 MG tablet Take 25 mg by mouth 3 (three) times daily as needed.    Historical Provider, MD  ELIQUIS 2.5 MG TABS tablet Take 2.5 mg by mouth daily.  02/21/16   Historical Provider, MD  EXELON 9.5 MG/24HR Place 9.5 mg onto the skin daily.  03/11/16   Historical Provider, MD  fluticasone (FLONASE) 50 MCG/ACT nasal spray Place 2 sprays into the nose daily.     Historical Provider, MD  furosemide (LASIX) 40 MG tablet Take 40 mg by mouth daily.    Historical Provider, MD  levothyroxine (SYNTHROID, LEVOTHROID) 50 MCG tablet Take 50 mcg by mouth daily.    Historical Provider, MD  omeprazole (PRILOSEC) 20 MG capsule Take 20 mg by mouth daily.     Historical Provider, MD  potassium chloride SA (K-DUR,KLOR-CON) 20 MEQ tablet Take 1 tablet (20 mEq total) by mouth 2 (two) times daily. 03/13/16   Elpidio Anis, PA-C  pramipexole (MIRAPEX) 0.25 MG tablet Take 0.25 mg by mouth at bedtime.    Historical Provider, MD  simvastatin (ZOCOR)  10 MG tablet Take 10 mg by mouth daily.    Historical Provider, MD  Tamsulosin HCl (FLOMAX) 0.4 MG CAPS Take 0.4 mg by mouth daily.    Historical Provider, MD  traZODone (DESYREL) 50 MG tablet Take 25 mg by mouth at bedtime as needed for sleep. Reported on 01/23/2016    Historical Provider, MD    Family History Family History  Problem Relation Age of Onset  . Heart attack Father 26  . Diabetes Son   . Diabetes Son   . Heart attack Son 39  . Breast cancer Daughter   . Rheum arthritis Mother     Social History Social History  Substance Use Topics  . Smoking status: Former Smoker    Packs/day: 2.00    Years: 40.00    Types: Cigarettes    Quit date: 07/07/1993  . Smokeless tobacco: Former  Neurosurgeon  . Alcohol use No     Allergies   Review of patient's allergies indicates no known allergies.   Review of Systems Review of Systems  Gastrointestinal: Positive for abdominal pain.  Musculoskeletal: Negative for back pain and neck pain.  Skin: Positive for wound.  Neurological: Positive for dizziness. Negative for syncope.   Physical Exam Updated Vital Signs BP 140/80 (BP Location: Right Arm)   Pulse 65   Temp 97.7 F (36.5 C) (Oral)   Resp 19   SpO2 100%   Physical Exam  Constitutional: He is oriented to person, place, and time. He appears well-developed and well-nourished.  HENT:  Head: Normocephalic and atraumatic.  Eyes: EOM are normal.  Neck: Normal range of motion.  Cardiovascular: Normal rate, regular rhythm, normal heart sounds and intact distal pulses.   Pulmonary/Chest: Effort normal and breath sounds normal. No respiratory distress.  Abdominal: Soft. He exhibits no distension. There is no tenderness.  Musculoskeletal: Normal range of motion. He exhibits no deformity.  Right elbow has a 2.5 cm skin tear. No deformity. Full ROM without discomfort.   Neurological: He is alert and oriented to person, place, and time.  Skin: Skin is warm and dry.  Psychiatric: He has a normal mood and affect. Judgment normal.  Nursing note and vitals reviewed.  ED Treatments / Results  Labs (all labs ordered are listed, but only abnormal results are displayed) Labs Reviewed  BASIC METABOLIC PANEL - Abnormal; Notable for the following:       Result Value   Sodium 120 (*)    Potassium 2.9 (*)    Chloride 83 (*)    Glucose, Bld 104 (*)    Creatinine, Ser 1.70 (*)    Calcium 8.2 (*)    GFR calc non Af Amer 35 (*)    GFR calc Af Amer 41 (*)    All other components within normal limits  CBC - Abnormal; Notable for the following:    RBC 2.62 (*)    Hemoglobin 8.7 (*)    HCT 24.1 (*)    MCHC 36.1 (*)    Platelets 105 (*)    All other components within normal limits    CBG MONITORING, ED - Abnormal; Notable for the following:    Glucose-Capillary 106 (*)    All other components within normal limits  URINALYSIS, ROUTINE W REFLEX MICROSCOPIC (NOT AT [facility]    EKG  EKG Interpretation  Date/Time:  Sunday April 20 2016 23:27:14 EDT Ventricular Rate:  67 PR Interval:    QRS Duration: 172 QT Interval:  466 QTC Calculation: 492 R Axis:   -  56 Text Interpretation:  Atrial fibrillation Ventricular premature complex RBBB and LAFB since last tracing no significant change Confirmed by [facility] Pa[facility]ENTZ  MD, ELLIOTT [phone]([id number]) on 04/20/2016 11:31:38 PM       Radiology No results found.  Procedures Procedures (including critical care time)  Medications Ordered in ED Medications - No data to display  DIAGNOSTIC STUDIES:  Oxygen Saturation is 100% on RA, normal by my interpretation.    COORDINATION OF CARE:  12:43 AM Discussed treatment plan with pt at bedside and pt agreed to plan.  Initial Impression / Assessment and Plan / ED Course  I have reviewed the triage vital signs and the nursing notes.  Pertinent labs & imaging results that were available during my care of the patient were reviewed by me and considered in my medical decision making (see chart for details).  Clinical Course    Patient presents after a fall at home. He reports feeling dizzy, then fell on the kitchen floor. He caused an abrasion to his right elbow it is not in need of any stitches or repair. His workup reveals a negative head CT. His laboratory studies are somewhat abnormal. His LFTs are slightly elevated and sodium is 120. I suspect this is related to consumption of alcohol. He was given normal saline for correction of this. He also has a hemoglobin which is slightly lower than previous results. He is denying any melena.  At this point, the patient is wanting to be discharged, rather than admitted. He assures me he will follow-up with his doctor in the next few days for a repeat sodium,  LFTs, and hemoglobin. He is to return as needed if things worsen.  I personally performed the services described in this documentation, which was scribed in my presence. The recorded information has been reviewed and is accurate.   Final Clinical Impressions(s) / ED Diagnoses   Final diagnoses:  None    New Prescriptions New Prescriptions   No medications on file     Xavier Lyonsouglas Asuna Peth, MD 04/21/16 [phone]

## 2016-04-28 ENCOUNTER — Inpatient Hospital Stay (HOSPITAL_COMMUNITY): Payer: Medicare Other

## 2016-04-28 ENCOUNTER — Inpatient Hospital Stay (HOSPITAL_COMMUNITY)
Admission: EM | Admit: 2016-04-28 | Discharge: 2016-05-07 | DRG: 433 | Disposition: A | Discharge status: Hospice | Payer: Medicare Other | Attending: Family Medicine | Admitting: Family Medicine

## 2016-04-28 ENCOUNTER — Ambulatory Visit: Payer: Medicare Other | Admitting: Internal Medicine

## 2016-04-28 ENCOUNTER — Ambulatory Visit (INDEPENDENT_AMBULATORY_CARE_PROVIDER_SITE_OTHER): Payer: Medicare Other | Admitting: Internal Medicine

## 2016-04-28 ENCOUNTER — Emergency Department (HOSPITAL_COMMUNITY): Payer: Medicare Other

## 2016-04-28 ENCOUNTER — Encounter (HOSPITAL_COMMUNITY): Payer: Self-pay

## 2016-04-28 DIAGNOSIS — K922 Gastrointestinal hemorrhage, unspecified: Secondary | ICD-10-CM | POA: Diagnosis present

## 2016-04-28 DIAGNOSIS — I481 Persistent atrial fibrillation: Secondary | ICD-10-CM | POA: Diagnosis present

## 2016-04-28 DIAGNOSIS — F1010 Alcohol abuse, uncomplicated: Secondary | ICD-10-CM | POA: Diagnosis not present

## 2016-04-28 DIAGNOSIS — K819 Cholecystitis, unspecified: Secondary | ICD-10-CM

## 2016-04-28 DIAGNOSIS — Z7951 Long term (current) use of inhaled steroids: Secondary | ICD-10-CM

## 2016-04-28 DIAGNOSIS — Z66 Do not resuscitate: Secondary | ICD-10-CM | POA: Diagnosis present

## 2016-04-28 DIAGNOSIS — N183 Chronic kidney disease, stage 3 unspecified: Secondary | ICD-10-CM | POA: Diagnosis present

## 2016-04-28 DIAGNOSIS — F05 Delirium due to known physiological condition: Secondary | ICD-10-CM | POA: Diagnosis not present

## 2016-04-28 DIAGNOSIS — W1830XA Fall on same level, unspecified, initial encounter: Secondary | ICD-10-CM | POA: Diagnosis present

## 2016-04-28 DIAGNOSIS — I482 Chronic atrial fibrillation, unspecified: Secondary | ICD-10-CM | POA: Diagnosis present

## 2016-04-28 DIAGNOSIS — N390 Urinary tract infection, site not specified: Secondary | ICD-10-CM | POA: Diagnosis not present

## 2016-04-28 DIAGNOSIS — E871 Hypo-osmolality and hyponatremia: Secondary | ICD-10-CM

## 2016-04-28 DIAGNOSIS — R451 Restlessness and agitation: Secondary | ICD-10-CM | POA: Diagnosis not present

## 2016-04-28 DIAGNOSIS — I25119 Atherosclerotic heart disease of native coronary artery with unspecified angina pectoris: Secondary | ICD-10-CM | POA: Diagnosis present

## 2016-04-28 DIAGNOSIS — R791 Abnormal coagulation profile: Secondary | ICD-10-CM

## 2016-04-28 DIAGNOSIS — R159 Full incontinence of feces: Secondary | ICD-10-CM | POA: Diagnosis present

## 2016-04-28 DIAGNOSIS — Z87891 Personal history of nicotine dependence: Secondary | ICD-10-CM

## 2016-04-28 DIAGNOSIS — J61 Pneumoconiosis due to asbestos and other mineral fibers: Secondary | ICD-10-CM | POA: Diagnosis present

## 2016-04-28 DIAGNOSIS — J449 Chronic obstructive pulmonary disease, unspecified: Secondary | ICD-10-CM | POA: Diagnosis present

## 2016-04-28 DIAGNOSIS — Z888 Allergy status to other drugs, medicaments and biological substances status: Secondary | ICD-10-CM

## 2016-04-28 DIAGNOSIS — I129 Hypertensive chronic kidney disease with stage 1 through stage 4 chronic kidney disease, or unspecified chronic kidney disease: Secondary | ICD-10-CM | POA: Diagnosis present

## 2016-04-28 DIAGNOSIS — K7040 Alcoholic hepatic failure without coma: Secondary | ICD-10-CM | POA: Diagnosis not present

## 2016-04-28 DIAGNOSIS — K7200 Acute and subacute hepatic failure without coma: Secondary | ICD-10-CM | POA: Diagnosis not present

## 2016-04-28 DIAGNOSIS — E039 Hypothyroidism, unspecified: Secondary | ICD-10-CM | POA: Diagnosis present

## 2016-04-28 DIAGNOSIS — T381X6A Underdosing of thyroid hormones and substitutes, initial encounter: Secondary | ICD-10-CM | POA: Diagnosis present

## 2016-04-28 DIAGNOSIS — D649 Anemia, unspecified: Secondary | ICD-10-CM

## 2016-04-28 DIAGNOSIS — R32 Unspecified urinary incontinence: Secondary | ICD-10-CM | POA: Diagnosis present

## 2016-04-28 DIAGNOSIS — D696 Thrombocytopenia, unspecified: Secondary | ICD-10-CM | POA: Diagnosis present

## 2016-04-28 DIAGNOSIS — R0602 Shortness of breath: Secondary | ICD-10-CM

## 2016-04-28 DIAGNOSIS — Z515 Encounter for palliative care: Secondary | ICD-10-CM

## 2016-04-28 DIAGNOSIS — Z6823 Body mass index (BMI) 23.0-23.9, adult: Secondary | ICD-10-CM

## 2016-04-28 DIAGNOSIS — N189 Chronic kidney disease, unspecified: Secondary | ICD-10-CM

## 2016-04-28 DIAGNOSIS — S51011A Laceration without foreign body of right elbow, initial encounter: Secondary | ICD-10-CM | POA: Diagnosis present

## 2016-04-28 DIAGNOSIS — R531 Weakness: Secondary | ICD-10-CM | POA: Diagnosis present

## 2016-04-28 DIAGNOSIS — D684 Acquired coagulation factor deficiency: Secondary | ICD-10-CM | POA: Diagnosis not present

## 2016-04-28 DIAGNOSIS — D500 Iron deficiency anemia secondary to blood loss (chronic): Secondary | ICD-10-CM | POA: Diagnosis present

## 2016-04-28 DIAGNOSIS — K709 Alcoholic liver disease, unspecified: Secondary | ICD-10-CM | POA: Diagnosis not present

## 2016-04-28 DIAGNOSIS — J9811 Atelectasis: Secondary | ICD-10-CM | POA: Diagnosis present

## 2016-04-28 DIAGNOSIS — F10288 Alcohol dependence with other alcohol-induced disorder: Secondary | ICD-10-CM | POA: Diagnosis present

## 2016-04-28 DIAGNOSIS — R109 Unspecified abdominal pain: Secondary | ICD-10-CM

## 2016-04-28 DIAGNOSIS — I2510 Atherosclerotic heart disease of native coronary artery without angina pectoris: Secondary | ICD-10-CM | POA: Diagnosis present

## 2016-04-28 DIAGNOSIS — I248 Other forms of acute ischemic heart disease: Secondary | ICD-10-CM | POA: Diagnosis present

## 2016-04-28 DIAGNOSIS — E876 Hypokalemia: Secondary | ICD-10-CM | POA: Diagnosis present

## 2016-04-28 DIAGNOSIS — K760 Fatty (change of) liver, not elsewhere classified: Secondary | ICD-10-CM | POA: Diagnosis present

## 2016-04-28 DIAGNOSIS — Z79899 Other long term (current) drug therapy: Secondary | ICD-10-CM

## 2016-04-28 DIAGNOSIS — E860 Dehydration: Secondary | ICD-10-CM | POA: Diagnosis present

## 2016-04-28 DIAGNOSIS — Z7189 Other specified counseling: Secondary | ICD-10-CM

## 2016-04-28 DIAGNOSIS — Z9181 History of falling: Secondary | ICD-10-CM

## 2016-04-28 DIAGNOSIS — N179 Acute kidney failure, unspecified: Secondary | ICD-10-CM | POA: Diagnosis not present

## 2016-04-28 DIAGNOSIS — Z833 Family history of diabetes mellitus: Secondary | ICD-10-CM

## 2016-04-28 DIAGNOSIS — R945 Abnormal results of liver function studies: Secondary | ICD-10-CM

## 2016-04-28 DIAGNOSIS — R627 Adult failure to thrive: Secondary | ICD-10-CM | POA: Diagnosis present

## 2016-04-28 DIAGNOSIS — N400 Enlarged prostate without lower urinary tract symptoms: Secondary | ICD-10-CM | POA: Diagnosis present

## 2016-04-28 DIAGNOSIS — K219 Gastro-esophageal reflux disease without esophagitis: Secondary | ICD-10-CM | POA: Diagnosis present

## 2016-04-28 DIAGNOSIS — Z7901 Long term (current) use of anticoagulants: Secondary | ICD-10-CM

## 2016-04-28 DIAGNOSIS — R7989 Other specified abnormal findings of blood chemistry: Secondary | ICD-10-CM

## 2016-04-28 DIAGNOSIS — K529 Noninfective gastroenteritis and colitis, unspecified: Secondary | ICD-10-CM | POA: Diagnosis present

## 2016-04-28 DIAGNOSIS — D689 Coagulation defect, unspecified: Secondary | ICD-10-CM | POA: Diagnosis present

## 2016-04-28 DIAGNOSIS — Z9114 Patient's other noncompliance with medication regimen: Secondary | ICD-10-CM

## 2016-04-28 DIAGNOSIS — R932 Abnormal findings on diagnostic imaging of liver and biliary tract: Secondary | ICD-10-CM | POA: Diagnosis not present

## 2016-04-28 DIAGNOSIS — F10239 Alcohol dependence with withdrawal, unspecified: Secondary | ICD-10-CM | POA: Diagnosis not present

## 2016-04-28 DIAGNOSIS — Z951 Presence of aortocoronary bypass graft: Secondary | ICD-10-CM

## 2016-04-28 DIAGNOSIS — Z8249 Family history of ischemic heart disease and other diseases of the circulatory system: Secondary | ICD-10-CM

## 2016-04-28 DIAGNOSIS — R17 Unspecified jaundice: Secondary | ICD-10-CM

## 2016-04-28 DIAGNOSIS — B952 Enterococcus as the cause of diseases classified elsewhere: Secondary | ICD-10-CM | POA: Diagnosis not present

## 2016-04-28 DIAGNOSIS — R079 Chest pain, unspecified: Secondary | ICD-10-CM | POA: Diagnosis not present

## 2016-04-28 HISTORY — DX: Alcoholic liver disease, unspecified: K70.9

## 2016-04-28 HISTORY — DX: Thrombocytopenia, unspecified: D69.6

## 2016-04-28 HISTORY — DX: Acute and subacute hepatic failure without coma: K72.00

## 2016-04-28 LAB — CBC WITH DIFFERENTIAL/PLATELET
BASOS ABS: 0 10*3/uL (ref 0.0–0.1)
BASOS PCT: 0 %
Eosinophils Absolute: 0 10*3/uL (ref 0.0–0.7)
Eosinophils Relative: 0 %
HEMATOCRIT: 19.8 % — AB (ref 39.0–52.0)
HEMOGLOBIN: 7.1 g/dL — AB (ref 13.0–17.0)
LYMPHS PCT: 16 %
Lymphs Abs: 1.2 10*3/uL (ref 0.7–4.0)
MCH: 34.6 pg — ABNORMAL HIGH (ref 26.0–34.0)
MCHC: 35.9 g/dL (ref 30.0–36.0)
MCV: 96.6 fL (ref 78.0–100.0)
MONO ABS: 0.5 10*3/uL (ref 0.1–1.0)
Monocytes Relative: 6 %
NEUTROS ABS: 5.8 10*3/uL (ref 1.7–7.7)
NEUTROS PCT: 77 %
Platelets: 115 10*3/uL — ABNORMAL LOW (ref 150–400)
RBC: 2.05 MIL/uL — AB (ref 4.22–5.81)
RDW: 15.3 % (ref 11.5–15.5)
WBC: 7.5 10*3/uL (ref 4.0–10.5)

## 2016-04-28 LAB — COMPREHENSIVE METABOLIC PANEL
ALBUMIN: 2.7 g/dL — AB (ref 3.5–5.0)
ALK PHOS: 126 U/L (ref 38–126)
ALT: 90 U/L — AB (ref 17–63)
AST: 164 U/L — AB (ref 15–41)
Anion gap: 12 (ref 5–15)
BILIRUBIN TOTAL: 5.1 mg/dL — AB (ref 0.3–1.2)
BUN: 20 mg/dL (ref 6–20)
CALCIUM: 8.3 mg/dL — AB (ref 8.9–10.3)
CO2: 27 mmol/L (ref 22–32)
CREATININE: 2.81 mg/dL — AB (ref 0.61–1.24)
Chloride: 89 mmol/L — ABNORMAL LOW (ref 101–111)
GFR calc Af Amer: 22 mL/min — ABNORMAL LOW (ref >60)
GFR, EST NON AFRICAN AMERICAN: 19 mL/min — AB (ref >60)
GLUCOSE: 122 mg/dL — AB (ref 65–99)
Potassium: 3.3 mmol/L — ABNORMAL LOW (ref 3.5–5.1)
Sodium: 128 mmol/L — ABNORMAL LOW (ref 135–145)
TOTAL PROTEIN: 5.7 g/dL — AB (ref 6.5–8.1)

## 2016-04-28 LAB — URINALYSIS, ROUTINE W REFLEX MICROSCOPIC
GLUCOSE, UA: NEGATIVE mg/dL
HGB URINE DIPSTICK: NEGATIVE
KETONES UR: NEGATIVE mg/dL
LEUKOCYTES UA: NEGATIVE
Nitrite: NEGATIVE
PH: 6 (ref 5.0–8.0)
PROTEIN: NEGATIVE mg/dL
Specific Gravity, Urine: 1.011 (ref 1.005–1.030)

## 2016-04-28 LAB — I-STAT CHEM 8, ED
BUN: 27 mg/dL — ABNORMAL HIGH (ref 6–20)
CREATININE: 3 mg/dL — AB (ref 0.61–1.24)
Calcium, Ion: 0.94 mmol/L — ABNORMAL LOW (ref 1.15–1.40)
Chloride: 84 mmol/L — ABNORMAL LOW (ref 101–111)
Glucose, Bld: 122 mg/dL — ABNORMAL HIGH (ref 65–99)
HEMATOCRIT: 24 % — AB (ref 39.0–52.0)
HEMOGLOBIN: 8.2 g/dL — AB (ref 13.0–17.0)
POTASSIUM: 3.4 mmol/L — AB (ref 3.5–5.1)
SODIUM: 125 mmol/L — AB (ref 135–145)
TCO2: 29 mmol/L (ref 0–100)

## 2016-04-28 LAB — MAGNESIUM: MAGNESIUM: 1.3 mg/dL — AB (ref 1.7–2.4)

## 2016-04-28 LAB — AMMONIA: AMMONIA: 48 umol/L — AB (ref 9–35)

## 2016-04-28 LAB — PROTIME-INR
INR: 2.9
Prothrombin Time: 30.9 seconds — ABNORMAL HIGH (ref 11.4–15.2)

## 2016-04-28 LAB — POC OCCULT BLOOD, ED: Fecal Occult Bld: NEGATIVE

## 2016-04-28 MED ORDER — THIAMINE HCL 100 MG/ML IJ SOLN: 100.0000 mg | Freq: Every day | INTRAMUSCULAR | Status: DC

## 2016-04-28 MED ORDER — IOPAMIDOL (ISOVUE-300) INJECTION 61%
INTRAVENOUS | Status: AC
  Filled 2016-04-28: qty 30

## 2016-04-28 MED ORDER — LEVOTHYROXINE SODIUM 50 MCG PO TABS
50.0000 ug | ORAL_TABLET | Freq: Every day | ORAL | Status: DC
  Administered 2016-04-29 – 2016-05-07 (×9): 50 ug via ORAL
  Filled 2016-04-28 (×9): qty 1

## 2016-04-28 MED ORDER — VITAMIN B-1 100 MG PO TABS
100.0000 mg | ORAL_TABLET | Freq: Every day | ORAL | Status: DC
  Administered 2016-04-29 – 2016-05-04 (×6): 100 mg via ORAL
  Filled 2016-04-28 (×8): qty 1

## 2016-04-28 MED ORDER — ONDANSETRON HCL 4 MG PO TABS
4.0000 mg | ORAL_TABLET | Freq: Four times a day (QID) | ORAL | Status: DC | PRN
  Administered 2016-05-03: 4 mg via ORAL
  Filled 2016-04-28: qty 1

## 2016-04-28 MED ORDER — SODIUM CHLORIDE 0.9 % IV BOLUS (SEPSIS)
1000.0000 mL | Freq: Once | INTRAVENOUS | Status: AC
  Administered 2016-04-28: 1000 mL via INTRAVENOUS

## 2016-04-28 MED ORDER — ONDANSETRON HCL 4 MG/2ML IJ SOLN
4.0000 mg | Freq: Four times a day (QID) | INTRAMUSCULAR | Status: DC | PRN
  Administered 2016-05-01: 4 mg via INTRAVENOUS
  Filled 2016-04-28 (×2): qty 2

## 2016-04-28 MED ORDER — MAGNESIUM SULFATE 50 % IJ SOLN: 2.0000 g | Freq: Once | INTRAMUSCULAR | Status: DC

## 2016-04-28 MED ORDER — POTASSIUM CHLORIDE 10 MEQ/100ML IV SOLN: 10.0000 meq | INTRAVENOUS | Status: DC

## 2016-04-28 MED ORDER — MAGNESIUM SULFATE 2 GM/50ML IV SOLN
2.0000 g | Freq: Once | INTRAVENOUS | Status: AC
  Administered 2016-04-29: 2 g via INTRAVENOUS
  Filled 2016-04-28: qty 50

## 2016-04-28 MED ORDER — SODIUM CHLORIDE 0.9% FLUSH: 3.0000 mL | Freq: Two times a day (BID) | INTRAVENOUS | Status: DC

## 2016-04-28 NOTE — ED Notes (Signed)
Report attempted 

## 2016-04-28 NOTE — ED Triage Notes (Signed)
Pt coming with generalized weakness from home. Multiple recent falls. Laceration on right elbow. Jaundice at arrival with hx of ETOH last drink yesterday. f

## 2016-04-28 NOTE — H&P (Signed)
Xavier Taylor KGM:[id number] DOB: [birth year] DOA: 04/28/2016     PCP: Ginette Otto, MD   Outpatient Specialists: cardiology, pulmonology wert Patient coming from:     home Lives  With family    Chief Complaint: confusion and weakness  HPI: Xavier Taylor is a 82 y.o. male with medical history significant of CAD sp CABG, Alcohol abuse,      Presented with feeling fatigued family was concerned because patient has been, and more pale. They brought him in for further evaluation. Daughter reports that patient has been drinking heavily for the past 50 years she drinks about 4 cups of Southern comfort a day. Family have taking away his car keys but a in exchange agreed to by off a whole for him. They attempted to decrease his alcohol intake by half but he developed tremors and had to stop. Family and patient endorses easy bruising. They have care provider who reported blood in stool. Patient states he is interested currently in quitting drinking. He denies any leg swelling. No hematoma emesis no hematemesis   Patient endorses chronic diarrhea that has been worse lately. Denies any fevers or chills no cough no shortness of breath or chest pain  Regarding pertinent Chronic problems:  Echocardiography in 2014 demonstrated a preserved ejection fraction. He has had atrial fibrillation with previous cardioversion but he has apparently been in chronic atrial fibrillation for a couple of years currently on anticoagulation Patient denies hx of seizures  IN ER:  Temp (24hrs), Avg:97.4 F (36.3 C), Min:97.4 F (36.3 C), Max:97.4 F (36.3 C)     RR 22 oxygen saturation 99% HR 76 BP 109/69   Sodium 125 K3.4 BUN 27 creatinine 3.0 up from baseline 1.70  On 15th of October  PLt 115.  WBC 7.5 Hg 7.1 down from 8.7 on 15th Elevated LFTs total bili 5.1 Ammonia 48 INR 2.9 CT head nonacute KUB ? enteritis Following Medications were ordered in ER: Medications  sodium chloride 0.9 % bolus 1,000  mL (0 mLs Intravenous Stopped 04/28/16 1723)     Hospitalist was called for admission for Liver failure, acute on chronic kidney failure, symptomatic anemia or blood per rectum  Review of Systems:    Pertinent positives include: fatigue, blood in stool, easy bruising  Constitutional:  No weight loss, night sweats, Fevers, chills,  weight loss  HEENT:  No headaches, Difficulty swallowing,Tooth/dental problems,Sore throat,  No sneezing, itching, ear ache, nasal congestion, post nasal drip,  Cardio-vascular:  No chest pain, Orthopnea, PND, anasarca, dizziness, palpitations.no Bilateral lower extremity swelling  GI:  No heartburn, indigestion, abdominal pain, nausea, vomiting, diarrhea, change in bowel habits, loss of appetite, melena,  hematemesis Resp:  no shortness of breath at rest. No dyspnea on exertion, No excess mucus, no productive cough, No non-productive cough, No coughing up of blood.No change in color of mucus.No wheezing. Skin:  no rash or lesions. No jaundice GU:  no dysuria, change in color of urine, no urgency or frequency. No straining to urinate.  No flank pain.  Musculoskeletal:  No joint pain or no joint swelling. No decreased range of motion. No back pain.  Psych:  No change in mood or affect. No depression or anxiety. No memory loss.  Neuro: no localizing neurological complaints, no tingling, no weakness, no double vision, no gait abnormality, no slurred speech, no confusion  As per HPI otherwise 10 point review of systems negative.   Past Medical History: Past Medical History:  Diagnosis Date  .  A-fib (HCC)   . Ascites   . BPH (benign prostatic hypertrophy)   . CAD (coronary artery disease)   . Chronic renal disease, stage III   . COPD (chronic obstructive pulmonary disease) (HCC)   . Coronary artery disease   . Decreased GFR   . GERD with stricture   . Hypertension   . Hypothyroid   . Internal hemorrhoids   . Renal disorder   . S/P CABG x 5     Past Surgical History:  Procedure Laterality Date  . APPENDECTOMY    . BACK SURGERY    . CARDIAC SURGERY       Social History:  Ambulatory   Independently But lately she has been so weak could not walk   reports that he quit smoking about 22 years ago. His smoking use included Cigarettes. He has a 80.00 pack-year smoking history. He has quit using smokeless tobacco. He reports that he drinks about 16.8 oz of alcohol per week . His drug history is not on file.  Allergies:   Allergies  Allergen Reactions  . Exelon [Rivastigmine] Other (See Comments)    Didn't work for patient       Family History:   Family History  Problem Relation Age of Onset  . Heart attack Father [id number]  . Diabetes Son   . Diabetes Son   . Heart attack Son 853  . Breast cancer Daughter   . Rheum arthritis Mother     Medications: Prior to Admission medications   Medication Sig Start Date End Date Taking? Authorizing Provider  carvedilol (COREG) 12.5 MG tablet Take 12.5 mg by mouth 2 (two) times daily with a meal.   Yes Historical Provider, MD  ELIQUIS 2.5 MG TABS tablet Take 2.5 mg by mouth daily.  02/21/16  Yes Historical Provider, MD  fluticasone (FLONASE) 50 MCG/ACT nasal spray Place 2 sprays into the nose daily.    Yes Historical Provider, MD  furosemide (LASIX) 40 MG tablet Take 40 mg by mouth daily.   Yes Historical Provider, MD  levothyroxine (SYNTHROID, LEVOTHROID) 50 MCG tablet Take 50 mcg by mouth daily.   Yes Historical Provider, MD  Multiple Vitamin (MULTIVITAMIN WITH MINERALS) TABS tablet Take 1 tablet by mouth daily.   Yes Historical Provider, MD  omeprazole (PRILOSEC) 20 MG capsule Take 20 mg by mouth daily.    Yes Historical Provider, MD  pramipexole (MIRAPEX) 0.25 MG tablet Take 0.25 mg by mouth at bedtime.   Yes Historical Provider, MD  simvastatin (ZOCOR) 10 MG tablet Take 10 mg by mouth daily at 6 PM.    Yes Historical Provider, MD  Tamsulosin HCl (FLOMAX) 0.4 MG CAPS Take 0.4 mg by  mouth daily.   Yes Historical Provider, MD  chlorproMAZINE (THORAZINE) 25 MG tablet Take 25 mg by mouth 3 (three) times daily as needed for hiccoughs.     Historical Provider, MD  potassium chloride SA (K-DUR,KLOR-CON) 20 MEQ tablet Take 1 tablet (20 mEq total) by mouth 2 (two) times daily. Patient not taking: Reported on 04/28/2016 03/13/16   Elpidio AnisShari Upstill, PA-C    Physical Exam: Patient Vitals for the past 24 hrs:  BP Temp Temp src Pulse Resp SpO2 Height Weight  04/28/16 1945 141/85 - - 80 17 100 % - -  04/28/16 1930 133/75 - - 68 16 98 % - -  04/28/16 1815 114/66 - - 74 15 100 % - -  04/28/16 1800 127/71 - - 66 13 100 % - -  04/28/16 1745 126/61 - - 74 14 100 % - -  04/28/16 1730 129/57 - - 70 21 100 % - -  04/28/16 1715 121/63 - - 86 19 100 % - -  04/28/16 1700 (!) 111/101 - - 69 23 100 % - -  04/28/16 1645 121/95 - - 95 19 94 % - -  04/28/16 1636 - - - - - - 5\' 8"  (1.727 m) 74.8 kg (165 lb)  04/28/16 1635 111/66 97.4 F (36.3 C) Oral 72 20 100 % - -  04/28/16 1621 (!) 73/43 - - 118 10 100 % - -    1. General:  in No Acute distress 2. Psychological: Alert and   Oriented 3. Head/ENT:    Dry Mucous Membranes                          Head Non traumatic, neck supple                            Poor Dentition 4. SKIN:   decreased Skin turgor,  Skin clean Dry and intact no rash  bruises noted 5. Heart: Regular rate and rhythm   Murmur, Rub or gallop 6. Lungs:   no wheezes mild crackles   7. Abdomen: Soft,  non-tender, Non distended, hepatomegaly noted  8. Lower extremities: no clubbing, cyanosis, or edema 9. Neurologically Grossly intact, moving all 4 extremities equally  no asterixis mild tremor  10. MSK: Normal range of motion   body mass index is 25.09 kg/m.  Labs on Admission:   Labs on Admission: I have personally reviewed following labs and imaging studies  CBC:  Recent Labs Lab 04/28/16 1647 04/28/16 1648  WBC 7.5  --   NEUTROABS 5.8  --   HGB 7.1* 8.2*  HCT  19.8* 24.0*  MCV 96.6  --   PLT 115*  --    Basic Metabolic Panel:  Recent Labs Lab 04/28/16 1647 04/28/16 1648  NA 128* 125*  K 3.3* 3.4*  CL 89* 84*  CO2 27  --   GLUCOSE 122* 122*  BUN 20 27*  CREATININE 2.81* 3.00*  CALCIUM 8.3*  --   MG 1.3*  --    GFR: Estimated Creatinine Clearance: 17.7 mL/min (by C-G formula based on SCr of 3 mg/dL (H)). Liver Function Tests:  Recent Labs Lab 04/28/16 1647  AST 164*  ALT 90*  ALKPHOS 126  BILITOT 5.1*  PROT 5.7*  ALBUMIN 2.7*   No results for input(s): LIPASE, AMYLASE in the last 168 hours.  Recent Labs Lab 04/28/16 1647  AMMONIA 48*   Coagulation Profile:  Recent Labs Lab 04/28/16 1647  INR 2.90   Cardiac Enzymes: No results for input(s): CKTOTAL, CKMB, CKMBINDEX, TROPONINI in the last 168 hours. BNP (last 3 results)  Recent Labs  01/23/16 1521  PROBNP 650.0*   HbA1C: No results for input(s): HGBA1C in the last 72 hours. CBG: No results for input(s): GLUCAP in the last 168 hours. Lipid Profile: No results for input(s): CHOL, HDL, LDLCALC, TRIG, CHOLHDL, LDLDIRECT in the last 72 hours. Thyroid Function Tests: No results for input(s): TSH, T4TOTAL, FREET4, T3FREE, THYROIDAB in the last 72 hours. Anemia Panel: No results for input(s): VITAMINB12, FOLATE, FERRITIN, TIBC, IRON, RETICCTPCT in the last 72 hours. Urine analysis:    Component Value Date/Time   COLORURINE AMBER (A) 04/28/2016 2025   APPEARANCEUR CLEAR 04/28/2016 2025  LABSPEC 1.011 04/28/2016 2025   PHURINE 6.0 04/28/2016 2025   GLUCOSEU NEGATIVE 04/28/2016 2025   HGBUR NEGATIVE 04/28/2016 2025   BILIRUBINUR SMALL (A) 04/28/2016 2025   KETONESUR NEGATIVE 04/28/2016 2025   PROTEINUR NEGATIVE 04/28/2016 2025   NITRITE NEGATIVE 04/28/2016 2025   LEUKOCYTESUR NEGATIVE 04/28/2016 2025   Sepsis Labs: @LABRCNTIP (procalcitonin:4,lacticidven:4) )No results found for this or any previous visit (from the past 240 hour(s)).    UA  no evidence  of UTI     No results found for: HGBA1C  Estimated Creatinine Clearance: 17.7 mL/min (by C-G formula based on SCr of 3 mg/dL (H)).  BNP (last 3 results)  Recent Labs  01/23/16 1521  PROBNP 650.0*     ECG REPORT  Independently reviewed ordered  Filed Weights   04/28/16 1636  Weight: 74.8 kg (165 lb)     Cultures: No results found for: SDES, SPECREQUEST, CULT, REPTSTATUS   Radiological Exams on Admission: Ct Head Wo Contrast  Result Date: 04/28/2016 CLINICAL DATA:  Generalized weakness.  Multiple recent falls. EXAM: CT HEAD WITHOUT CONTRAST TECHNIQUE: Contiguous axial images were obtained from the base of the skull through the vertex without intravenous contrast. COMPARISON:  04/21/2016 FINDINGS: Brain: Moderate cerebral atrophy is unchanged. Periventricular white matter hypodensities are unchanged and nonspecific but compatible with mild chronic small vessel ischemic disease. There is no evidence of acute cortical infarct, intracranial hemorrhage, mass, midline shift, or extra-axial fluid collection. Vascular: Calcified atherosclerosis at the skullbase. Skull: No fracture. Well-defined 2 cm sclerotic lesion in the right parietal bone adjacent to the sagittal suture is unchanged. Sinuses/Orbits: Unchanged mild right sphenoid sinus mucosal thickening. Clear mastoid air cells. Prior bilateral cataract extraction. Other: None. IMPRESSION: 1. No evidence of acute intracranial abnormality. 2. Mild chronic small vessel ischemic disease and moderate cerebral atrophy. Electronically Signed   By: Sebastian Ache M.D.   On: 04/28/2016 19:10   Dg Abd Portable 1 View  Result Date: 04/28/2016 CLINICAL DATA:  Generalized weakness. EXAM: PORTABLE ABDOMEN - 1 VIEW COMPARISON:  CT 12/11/2015. FINDINGS: Soft tissue structures are unremarkable. No bowel distention. Nondistended air-filled loops of small and large bowel are noted. Mild thickening of small-bowel loops noted left upper quadrant. Process  such enteritis cannot be excluded. Aortoiliac atherosclerotic vascular calcification. IMPRESSION: 1. Mild thickening of left upper quadrant proximal small bowel loops cannot be excluded. A process such as enteritis cannot be completely excluded. Exam otherwise unremarkable. No evidence of free air. 2. Aortoiliac atherosclerotic vascular disease . Electronically Signed   By: Maisie Fus  Register   On: 04/28/2016 17:14    Chart has been reviewed    Assessment/Plan  82 y.o. male with medical history significant of CAD sp CABG, A. Fib Alcohol abuse,  admitted for alcoholic liver failure, acute on chronic renal failure, hyponatremia, hypokalemia  Present on Admission: . Alcoholic liver disease (HCC) - noted worsening liver failure presumed alcohol induced, discussed case with GI who will see patient in consult . Atrial fibrillation, chronic (HCC) - will discontinue anticoagulation given elevated INR alcohol abuse liver failure and risk of falls currently appears to be rate controlled . CAD (coronary artery disease) stable currently chest pain-free hold on statins while having elevated LFTs . Anemia we'll transfuse 1 unit for now and follow up CBC. Appreciate GI consult history of bright red blood in stool but per family small amounts. Obtain anemia panel . Hyponatremia in a setting of alcohol abuse and possibly liver disease. We will obtain urine electrolytes check TSH gentle rehydration .  Hypokalemia replace Acute on chronic renal failure in this setting of diarrhea will rehydrate gently obtain urine electrolytes dissected to dehydration versus hepatorenal syndrome . Hypomagnesemia -  replace . COPD (chronic obstructive pulmonary disease) (HCC) stable currently appears to be symptomatic . Alcohol abuse- CIWA protocol  . Acute liver failure - slightly alcohol induced appreciate GI consult MELD score 36 this 52.6% estimated 3 months mortality Diarrhea will obtain CT of abdomen to evaluate for colitis  and presence of cirrhosis versus any intrahepatic lesions obtain stool studies  Other plan as per orders.  DVT prophylaxis:  SCD     Code Status:   DNR/DNI as per patient   Family Communication:   Family   at  Bedside  plan of care was discussed with   Daughter,   Disposition Plan:    To home once workup is complete and patient is stable Patient at this point refusing placement although would probably benefit                       Would benefit from PT/OT eval prior to DC   ordered                                                 Consults called: [facility] Dr. Dulce Sellar    Admission status:   Inpatient    Level of care    Tele      I have spent a total of 67 min on this admission  extra time was spent to discuss case Length with patient family and  GI MD  Bridie Colquhoun 04/28/2016, 11:20 PM     [facility]  Pager [phone]   after 2 AM please page floor coverage PA If 7AM-7PM, please contact the day team taking care of the patient  [email or web address]  Password [email or web address]

## 2016-04-29 ENCOUNTER — Encounter (HOSPITAL_COMMUNITY): Payer: Self-pay | Admitting: Internal Medicine

## 2016-04-29 ENCOUNTER — Inpatient Hospital Stay (HOSPITAL_COMMUNITY): Payer: Medicare Other

## 2016-04-29 DIAGNOSIS — N179 Acute kidney failure, unspecified: Secondary | ICD-10-CM | POA: Diagnosis present

## 2016-04-29 DIAGNOSIS — D689 Coagulation defect, unspecified: Secondary | ICD-10-CM | POA: Diagnosis present

## 2016-04-29 DIAGNOSIS — D696 Thrombocytopenia, unspecified: Secondary | ICD-10-CM

## 2016-04-29 DIAGNOSIS — N183 Chronic kidney disease, stage 3 (moderate): Secondary | ICD-10-CM

## 2016-04-29 DIAGNOSIS — E039 Hypothyroidism, unspecified: Secondary | ICD-10-CM | POA: Diagnosis present

## 2016-04-29 HISTORY — DX: Thrombocytopenia, unspecified: D69.6

## 2016-04-29 LAB — COMPREHENSIVE METABOLIC PANEL
ALT: 81 U/L — ABNORMAL HIGH (ref 17–63)
AST: 142 U/L — ABNORMAL HIGH (ref 15–41)
Albumin: 2.6 g/dL — ABNORMAL LOW (ref 3.5–5.0)
Alkaline Phosphatase: 120 U/L (ref 38–126)
Anion gap: 13 (ref 5–15)
BILIRUBIN TOTAL: 4.5 mg/dL — AB (ref 0.3–1.2)
BUN: 23 mg/dL — ABNORMAL HIGH (ref 6–20)
CHLORIDE: 88 mmol/L — AB (ref 101–111)
CO2: 23 mmol/L (ref 22–32)
CREATININE: 2.72 mg/dL — AB (ref 0.61–1.24)
Calcium: 8 mg/dL — ABNORMAL LOW (ref 8.9–10.3)
GFR, EST AFRICAN AMERICAN: 23 mL/min — AB (ref >60)
GFR, EST NON AFRICAN AMERICAN: 20 mL/min — AB (ref >60)
Glucose, Bld: 100 mg/dL — ABNORMAL HIGH (ref 65–99)
POTASSIUM: 3.2 mmol/L — AB (ref 3.5–5.1)
Sodium: 124 mmol/L — ABNORMAL LOW (ref 135–145)
TOTAL PROTEIN: 5.2 g/dL — AB (ref 6.5–8.1)

## 2016-04-29 LAB — LIPID PANEL
Cholesterol: 75 mg/dL (ref 0–200)
HDL: 18 mg/dL — AB (ref >40)
LDL Cholesterol: 44 mg/dL (ref 0–99)
TRIGLYCERIDES: 67 mg/dL (ref <150)
Total CHOL/HDL Ratio: 4.2 RATIO
VLDL: 13 mg/dL (ref 0–40)

## 2016-04-29 LAB — MAGNESIUM: MAGNESIUM: 2 mg/dL (ref 1.7–2.4)

## 2016-04-29 LAB — RETICULOCYTES
RBC.: 1.87 MIL/uL — AB (ref 4.22–5.81)
RETIC COUNT ABSOLUTE: 69.2 10*3/uL (ref 19.0–186.0)
Retic Ct Pct: 3.7 % — ABNORMAL HIGH (ref 0.4–3.1)

## 2016-04-29 LAB — PROTIME-INR
INR: 2.9
PROTHROMBIN TIME: 30.9 s — AB (ref 11.4–15.2)

## 2016-04-29 LAB — CBC
HEMATOCRIT: 17.9 % — AB (ref 39.0–52.0)
Hemoglobin: 6.4 g/dL — CL (ref 13.0–17.0)
MCH: 34.2 pg — ABNORMAL HIGH (ref 26.0–34.0)
MCHC: 35.8 g/dL (ref 30.0–36.0)
MCV: 95.7 fL (ref 78.0–100.0)
PLATELETS: 103 10*3/uL — AB (ref 150–400)
RBC: 1.87 MIL/uL — AB (ref 4.22–5.81)
RDW: 15.4 % (ref 11.5–15.5)
WBC: 6.8 10*3/uL (ref 4.0–10.5)

## 2016-04-29 LAB — HEMOGLOBIN AND HEMATOCRIT, BLOOD
HEMATOCRIT: 22.1 % — AB (ref 39.0–52.0)
Hemoglobin: 7.8 g/dL — ABNORMAL LOW (ref 13.0–17.0)

## 2016-04-29 LAB — IRON AND TIBC
Iron: 49 ug/dL (ref 45–182)
Saturation Ratios: 43 % — ABNORMAL HIGH (ref 17.9–39.5)
TIBC: 113 ug/dL — ABNORMAL LOW (ref 250–450)
UIBC: 64 ug/dL

## 2016-04-29 LAB — VITAMIN B12: VITAMIN B 12: 2202 pg/mL — AB (ref 180–914)

## 2016-04-29 LAB — TSH: TSH: 19.713 u[IU]/mL — AB (ref 0.350–4.500)

## 2016-04-29 LAB — PHOSPHORUS
Phosphorus: 3.2 mg/dL (ref 2.5–4.6)
Phosphorus: 3.4 mg/dL (ref 2.5–4.6)

## 2016-04-29 LAB — FOLATE: FOLATE: 14.3 ng/mL (ref >5.9)

## 2016-04-29 LAB — CREATININE, URINE, RANDOM: Creatinine, Urine: 151.38 mg/dL

## 2016-04-29 LAB — SODIUM, URINE, RANDOM: SODIUM UR: 29 mmol/L

## 2016-04-29 LAB — FERRITIN: Ferritin: 815 ng/mL — ABNORMAL HIGH (ref 24–336)

## 2016-04-29 LAB — ABO/RH: ABO/RH(D): A POS

## 2016-04-29 LAB — PREPARE RBC (CROSSMATCH)

## 2016-04-29 LAB — OSMOLALITY, URINE: OSMOLALITY UR: 270 mosm/kg — AB (ref 300–900)

## 2016-04-29 LAB — AMMONIA: AMMONIA: 59 umol/L — AB (ref 9–35)

## 2016-04-29 MED ORDER — SODIUM CHLORIDE 0.9 % IV SOLN
Freq: Once | INTRAVENOUS | Status: AC
  Administered 2016-04-29: 06:00:00 via INTRAVENOUS

## 2016-04-29 MED ORDER — BOOST / RESOURCE BREEZE PO LIQD
1.0000 | Freq: Three times a day (TID) | ORAL | Status: DC
Start: 2016-04-29 — End: 2016-05-05
  Administered 2016-04-30 – 2016-05-04 (×9): 1 via ORAL

## 2016-04-29 MED ORDER — LORAZEPAM 2 MG/ML IJ SOLN
1.0000 mg | Freq: Four times a day (QID) | INTRAMUSCULAR | Status: AC | PRN
  Administered 2016-05-01 (×2): 1 mg via INTRAVENOUS
  Filled 2016-04-29 (×2): qty 1

## 2016-04-29 MED ORDER — PHYTONADIONE 1 MG/0.5 ML ORAL SOLUTION
1.0000 mg | Freq: Once | ORAL | Status: AC
  Administered 2016-04-29: 1 mg via ORAL
  Filled 2016-04-29: qty 0.5

## 2016-04-29 MED ORDER — ORAL CARE MOUTH RINSE
15.0000 mL | Freq: Two times a day (BID) | OROMUCOSAL | Status: DC
  Administered 2016-04-29 – 2016-05-07 (×13): 15 mL via OROMUCOSAL

## 2016-04-29 MED ORDER — POTASSIUM CHLORIDE 10 MEQ/100ML IV SOLN
10.0000 meq | INTRAVENOUS | Status: AC
  Administered 2016-04-29 (×2): 10 meq via INTRAVENOUS
  Filled 2016-04-29 (×2): qty 100

## 2016-04-29 MED ORDER — PRAMIPEXOLE DIHYDROCHLORIDE 0.25 MG PO TABS
0.2500 mg | ORAL_TABLET | Freq: Every day | ORAL | Status: DC
  Administered 2016-04-29 – 2016-05-06 (×7): 0.25 mg via ORAL
  Filled 2016-04-29 (×8): qty 1

## 2016-04-29 MED ORDER — SODIUM CHLORIDE 0.9 % IV SOLN
Freq: Once | INTRAVENOUS | Status: AC
  Administered 2016-04-29: 16:00:00 via INTRAVENOUS

## 2016-04-29 MED ORDER — FOLIC ACID 1 MG PO TABS
1.0000 mg | ORAL_TABLET | Freq: Every day | ORAL | Status: DC
  Administered 2016-04-29 – 2016-05-04 (×6): 1 mg via ORAL
  Filled 2016-04-29 (×8): qty 1

## 2016-04-29 MED ORDER — LORAZEPAM 1 MG PO TABS: 1.0000 mg | ORAL_TABLET | Freq: Four times a day (QID) | ORAL | Status: AC | PRN

## 2016-04-29 MED ORDER — CARVEDILOL 12.5 MG PO TABS
12.5000 mg | ORAL_TABLET | Freq: Two times a day (BID) | ORAL | Status: DC
  Administered 2016-04-29 – 2016-05-06 (×13): 12.5 mg via ORAL
  Filled 2016-04-29 (×14): qty 1

## 2016-04-29 MED ORDER — ADULT MULTIVITAMIN W/MINERALS CH
1.0000 | ORAL_TABLET | Freq: Every day | ORAL | Status: DC
  Administered 2016-04-29 – 2016-05-04 (×6): 1 via ORAL
  Filled 2016-04-29 (×8): qty 1

## 2016-04-29 MED ORDER — POTASSIUM CHLORIDE IN NACL 40-0.9 MEQ/L-% IV SOLN
INTRAVENOUS | Status: DC
  Administered 2016-04-29 – 2016-05-01 (×4): 75 mL/h via INTRAVENOUS
  Filled 2016-04-29 (×6): qty 1000

## 2016-04-29 MED ORDER — ENSURE ENLIVE PO LIQD
237.0000 mL | Freq: Two times a day (BID) | ORAL | Status: DC
  Administered 2016-04-29: 237 mL via ORAL

## 2016-04-29 MED ORDER — DIPHENHYDRAMINE HCL 25 MG PO CAPS
25.0000 mg | ORAL_CAPSULE | Freq: Once | ORAL | Status: AC
  Administered 2016-04-29: 25 mg via ORAL
  Filled 2016-04-29: qty 1

## 2016-04-29 MED ORDER — SODIUM CHLORIDE 0.9 % IV SOLN
INTRAVENOUS | Status: DC
  Administered 2016-04-29: 03:00:00 via INTRAVENOUS

## 2016-04-29 MED ORDER — ALBUTEROL SULFATE (2.5 MG/3ML) 0.083% IN NEBU
2.5000 mg | INHALATION_SOLUTION | RESPIRATORY_TRACT | Status: DC | PRN
  Administered 2016-05-01: 2.5 mg via RESPIRATORY_TRACT
  Filled 2016-04-29: qty 3

## 2016-04-29 MED ORDER — SODIUM CHLORIDE 0.9 % IV SOLN: Freq: Once | INTRAVENOUS | Status: DC

## 2016-04-29 MED ORDER — TAMSULOSIN HCL 0.4 MG PO CAPS
0.4000 mg | ORAL_CAPSULE | Freq: Every day | ORAL | Status: DC
  Administered 2016-04-29 – 2016-05-04 (×6): 0.4 mg via ORAL
  Filled 2016-04-29 (×8): qty 1

## 2016-04-29 NOTE — Progress Notes (Signed)
Initial Nutrition Assessment  DOCUMENTATION CODES:   Not applicable  INTERVENTION:   -Boost Breeze po TID, each supplement provides 250 kcal and 9 grams of protein  NUTRITION DIAGNOSIS:   Inadequate oral intake related to altered GI function as evidenced by per patient/family report.  GOAL:   Patient will meet greater than or equal to 90% of their needs  MONITOR:   PO intake, Supplement acceptance, Diet advancement, Labs, Weight trends, Skin, I & O's  REASON FOR ASSESSMENT:   Malnutrition Screening Tool    ASSESSMENT:   82 y.o. male with medical history significant of CAD sp CABG, A. Fib Alcohol abuse,  admitted for alcoholic liver failure, acute on chronic renal failure, hyponatremia, hypokalemia  Pt admitted with alcoholic liver failure and ARF.   Pt is a resident of [facility]. He reports he receives 3 meals per day, but typically only eats one meals per day (dinner). Pt reveals that commonly consumed beverages are lemonade and one bottle of Ensure daily. Per H&P, pt also consumes 4 cups of Southern Comfort daily.   Pt denies weight loss, but reports "I don't really keep up with my weight". He shares UBW is around 165#. Per wt hx, pt has experienced a 10.4% wt loss over the past year.   Nutrition-Focused physical exam completed. Findings are mild to moderate fat depletion, mild to moderate muscle depletion, and mild edema. Pt reports decline in activity level related to balance. Also noted very loose wedding ring on pt finger, however, pt shares "it's always been like that".   Suspect malnutrition, however, unable to identify at this time. Pt is at high risk for developing malnutrition secondary to poor oral intake and ETOH abuse.   Medications reviewed and include folvite, MVI, and vitamin B-1.   Labs reviewed: Na: 124, K: 3.2 (on IV supplementation).   Diet Order:  Diet clear liquid Room service appropriate? Yes; Fluid consistency: Thin Diet NPO time  specified  Skin:  Reviewed, no issues  Last BM:  PTA  Height:   Ht Readings from Last 1 Encounters:  04/28/16 5\' 8"  (1.727 m)    Weight:   Wt Readings from Last 1 Encounters:  04/28/16 155 lb 14.4 oz (70.7 kg)    Ideal Body Weight:  70 kg  BMI:  Body mass index is 23.7 kg/m.  Estimated Nutritional Needs:   Kcal:  1700-1900  Protein:  80-95 grams  Fluid:  1.7-1.9 L  EDUCATION NEEDS:   Education needs addressed  Renato Spellman A. Mayford KnifeWilliams, RD, LDN, CDE Pager: [phone] After hours Pager: [phone]

## 2016-04-29 NOTE — Progress Notes (Signed)
CSW received consult that pt is from [facility]- no CSW involvement needed for pt return to Independent living level of care  CSW signing off- Please reconsult if needed  Burna SisJenna H. Arjun Hard, LCSW Clinical Social Worker [phone]

## 2016-04-29 NOTE — [place] FL2 (Signed)
[place] MEDICAID FL2 LEVEL OF CARE SCREENING TOOL     IDENTIFICATION  Patient Name: Xavier Taylor Birthdate: 01-14-1930 Sex: male Admission Date (Current Location): 04/28/2016  [facility] and [place] Number:  Producer, television/film/video and Address:  [facility], [address] [place], [place] [address]      Provider Number: [id number]  Attending Physician Name and Address:  Maryruth Bun Rama, MD  Relative Name and Phone Number:       Current Level of Care: Hospital Recommended Level of Care: Skilled Nursing Facility Prior Approval Number:    Date Approved/Denied:   PASRR Number: [id number]  Discharge Plan: SNF    Current Diagnoses: Patient Active Problem List   Diagnosis Date Noted  . Anemia 04/28/2016  . Hyponatremia 04/28/2016  . Hypokalemia 04/28/2016  . Alcoholic liver disease (HCC) 04/28/2016  . Hypomagnesemia 04/28/2016  . COPD (chronic obstructive pulmonary disease) (HCC) 04/28/2016  . Alcohol abuse 04/28/2016  . Acute liver failure 04/28/2016  . Asbestosis (HCC) 01/23/2016  . Dyspnea 01/23/2016  . CAD (coronary artery disease) 03/29/2015  . Atrial fibrillation, chronic (HCC) 03/29/2015    Orientation RESPIRATION BLADDER Height & Weight     Self, Time, Situation, Place  Normal Incontinent Weight: 155 lb 14.4 oz (70.7 kg) Height:  5\' 8"  (172.7 cm)  BEHAVIORAL SYMPTOMS/MOOD NEUROLOGICAL BOWEL NUTRITION STATUS      Continent Diet (see DC summary)  AMBULATORY STATUS COMMUNICATION OF NEEDS Skin   Limited Assist Verbally Normal                       Personal Care Assistance Level of Assistance  Bathing, Dressing Bathing Assistance: Limited assistance   Dressing Assistance: Limited assistance     Functional Limitations Info             SPECIAL CARE FACTORS FREQUENCY  PT (By licensed PT), OT (By licensed OT)     PT Frequency: 5/wk OT Frequency: 5/wk            Contractures      Additional Factors Info   Code Status, Allergies, Isolation Precautions Code Status Info: DNR Allergies Info: Exelon Rivastigmine     Isolation Precautions Info: Enteric- results still pending     Current Medications (04/29/2016):  This is the current hospital active medication list Current Facility-Administered Medications  Medication Dose Route Frequency Provider Last Rate Last Dose  . 0.9 %  sodium chloride infusion   Intravenous Once Christina P Rama, MD      . 0.9 % NaCl with KCl 40 mEq / L  infusion   Intravenous Continuous Maryruth Bun Rama, MD 75 mL/hr at 04/29/16 1500    . albuterol (PROVENTIL) (2.5 MG/3ML) 0.083% nebulizer solution 2.5 mg  2.5 mg Nebulization Q2H PRN Therisa Doyne, MD      . carvedilol (COREG) tablet 12.5 mg  12.5 mg Oral BID WC Therisa Doyne, MD   12.5 mg at 04/29/16 0809  . feeding supplement (ENSURE ENLIVE) (ENSURE ENLIVE) liquid 237 mL  237 mL Oral BID BM Therisa Doyne, MD   237 mL at 04/29/16 1024  . folic acid (FOLVITE) tablet 1 mg  1 mg Oral Daily Therisa Doyne, MD   1 mg at 04/29/16 1024  . levothyroxine (SYNTHROID, LEVOTHROID) tablet 50 mcg  50 mcg Oral QAC breakfast Therisa Doyne, MD   50 mcg at 04/29/16 0809  . LORazepam (ATIVAN) tablet 1 mg  1 mg Oral Q6H PRN Anastassia Doutova,  MD       Or  . LORazepam (ATIVAN) injection 1 mg  1 mg Intravenous Q6H PRN Therisa DoyneAnastassia Doutova, MD      . MEDLINE mouth rinse  15 mL Mouth Rinse BID Maryruth Bunhristina P Rama, MD   15 mL at 04/29/16 1330  . multivitamin with minerals tablet 1 tablet  1 tablet Oral Daily Therisa DoyneAnastassia Doutova, MD   1 tablet at 04/29/16 1024  . ondansetron (ZOFRAN) tablet 4 mg  4 mg Oral Q6H PRN Therisa DoyneAnastassia Doutova, MD       Or  . ondansetron (ZOFRAN) injection 4 mg  4 mg Intravenous Q6H PRN Therisa DoyneAnastassia Doutova, MD      . pramipexole (MIRAPEX) tablet 0.25 mg  0.25 mg Oral QHS Therisa DoyneAnastassia Doutova, MD      . tamsulosin (FLOMAX) capsule 0.4 mg  0.4 mg Oral Daily Therisa DoyneAnastassia Doutova, MD   0.4 mg at 04/29/16 1024  .  thiamine (VITAMIN B-1) tablet 100 mg  100 mg Oral Daily Therisa DoyneAnastassia Doutova, MD   100 mg at 04/29/16 1024   Or  . thiamine (B-1) injection 100 mg  100 mg Intravenous Daily Therisa DoyneAnastassia Doutova, MD         Discharge Medications: Please see discharge summary for a list of discharge medications.  Relevant Imaging Results:  Relevant Lab Results:   Additional Information SS#: [id number]  Burna SisUris, Cozetta Seif H, LCSW

## 2016-04-29 NOTE — Clinical Social Work Note (Signed)
Clinical Social Work Assessment  Patient Details  Name: Xavier Taylor MRN: [id number] Date of Birth: 1930-06-23  Date of referral:  04/29/16               Reason for consult:  Facility Placement                Permission sought to share information with:  Facility Sport and exercise psychologist, Family Supports Permission granted to share information::  Yes, Verbal Permission Granted  Name::     Ecologist::  SNF  Relationship::  dtr  Contact Information:     Housing/Transportation Living arrangements for the past 2 months:  [place] [employer]) Source of Information:  Patient, Spouse, Adult Children Patient Interpreter Needed:  None Criminal Activity/Legal Involvement Pertinent to Current Situation/Hospitalization:  No - Comment as needed Significant Relationships:  Adult Children Lives with:  Spouse Do you feel safe going back to the place where you live?  No Need for family participation in patient care:  Yes (Comment)  Care giving concerns: Pt lives in [place] facility with spouse- family does not think pt is safe to return home at this time.  Per MD notes they have concerns about pt drinking and also with weakness.   Social Worker assessment / plan:  CSW met with pt, pt wife, and pt dtr at bedside to discuss PT recommendation for SNF.  CSW discussed SNF and SNF referral process.  Pt has not been to SNF but knows people from his facility that have and will discuss recommendations for SNFs with them.  Employment status:  Retired Forensic scientist:  [employer] PT Recommendations:  [place] / Referral to community resources:  [place]  Patient/Family's Response to care:  Pt originally resistant to idea of SNF and prefers to go home but CSW discussed further along side family who expressed concerns of pt return home and pt became agreeable to try.   Patient/Family's Understanding of and Emotional Response to  Diagnosis, Current Treatment, and Prognosis:  No questions or concerns from pt but family expressing continuing concerns with pt wellbeing and are hopeful pt will benefit from short term rehab stay prior to return to [place].  Emotional Assessment Appearance:  Appears stated age Attitude/Demeanor/Rapport:    Affect (typically observed):  Appropriate Orientation:  Oriented to Self, Oriented to Place, Oriented to  Time, Oriented to Situation Alcohol / Substance use:  Alcohol Use Psych involvement (Current and /or in the community):  No (Comment)  Discharge Needs  Concerns to be addressed:  Care Coordination Readmission within the last 30 days:  No Current discharge risk:  Physical Impairment, Substance Abuse Barriers to Discharge:  Continued Medical Work up   Jorge Ny, LCSW 04/29/2016, 3:29 PM

## 2016-04-29 NOTE — Progress Notes (Addendum)
Progress Note    Xavier Taylor  WJX:[id number] DOB: [birth year]  DOA: 04/28/2016 PCP: Ginette Otto, MD    Brief Narrative:   Chief complaint: Follow-up confusion and weakness  Xavier Taylor is an 82 y.o. male with a PMH of CAD status post CABG, chronic atrial fibrillation on blood thinners and alcohol abuse who was admitted 04/28/16 with a chief complaint of confusion and weakness.  Assessment/Plan:   Principal Problem:   Alcoholic liver disease (HCC)/Acute liver failure with confusion and weakness The patient's elevated LFTs, low protein/albumin, thrombocytopenia and elevated ammonia levels suggest fairly advanced alcohol-induced liver disease. CT of the abdomen showed fatty infiltration of the liver. Ill-defined hypodensity in the right lobe of the liver noted, neoplastic process could not be excluded. Ultrasound showed fatty liver and a complex cyst in the right hepatic lobe measuring 1.9 cm with recommendations for MRI for further correlation. GI consulted to assist with further workup/management.The patient is currently awake and given his recent diarrhea, will not add lactulose right now but could consider adding this.  Active Problems:   Acute on chronic renal failure/stage III chronic kidney disease Baseline creatinine is around 1.5 with a GFR of around 36. Current creatinine elevated over usual baseline values with a GFR of 20. Gently hydrate. Follow creatinine.    Coagulopathy/thrombocytopenia Patient continues to lose blood from a skin tear on his right elbow, and he likely has GI bleeding as well. We'll give 2 units of fresh frozen plasma and a dose of vitamin K.    CAD (coronary artery disease) Given reports of dark stools, and markedly low hemoglobin, avoid aspirin. No complaints of chest pain. Statins on hold secondary to elevated LFTs.    Atrial fibrillation, chronic (HCC) Blood thinners discontinued by admitting physician given elevated INR in the  setting of alcohol abuse and high fall risk.    Anemia of chronic blood loss One unit of blood transfused overnight. Hemoglobin 7.8 this morning. EGD planned for tomorrow.    Hyponatremia Suspect cirrhosis physiology.    Hypokalemia Continue to replete.    Hypomagnesemia Repleted.    COPD (chronic obstructive pulmonary disease) (HCC) Controlled.    Alcohol abuse Continue alcohol detox per CIWA protocol.    Hypothyroidism TSH markedly elevated. Check free T4, on Synthroid 50 g daily. Suspect non-compliance.   Family Communication/Anticipated D/C date and plan/Code Status   DVT prophylaxis: SCDs ordered. Code Status: DNR.  Family Communication: Wife and daughter at the bedside. Disposition Plan: To be determined. Suspect he will need SNF placement.   Medical Consultants:    Gastroenterology   Procedures:    None  Anti-Infectives:    None  Subjective:   The patient reports that he has been a heavy drinker for the past 50 years, and that his drinking habits worsened over the past 9 months. Review of symptoms is positive for ongoing bleeding from a right elbow wound, confusion and weakness and negative for nausea/vomiting.  Objective:    Vitals:   04/29/16 0506 04/29/16 0508 04/29/16 0522 04/29/16 0805  BP: 118/71  124/64 117/71  Pulse: (!) 59  (!) 57 77  Resp: 18  18 18   Temp: 98 F (36.7 C)  97.9 F (36.6 C) 97.9 F (36.6 C)  TempSrc: Oral  Oral Oral  SpO2:  100% 100% 94%  Weight:      Height:        Intake/Output Summary (Last 24 hours) at 04/29/16 1527 Last data filed at 04/29/16  0840  Gross per 24 hour  Intake             1354 ml  Output              550 ml  Net              804 ml   Filed Weights   04/28/16 1636 04/28/16 2334  Weight: 74.8 kg (165 lb) 70.7 kg (155 lb 14.4 oz)    Exam: General exam: Appears calm and comfortable.  Respiratory system: Clear to auscultation. Respiratory effort normal. Cardiovascular system: HSIR. No  JVD,  rubs, gallops or clicks. II/VI murmur. Gastrointestinal system: Abdomen is nondistended, soft and nontender. No organomegaly or masses felt. Normal bowel sounds heard. Central nervous system: Alert, disoriented. No focal neurological deficits. Extremities: No clubbing,  or cyanosis. No edema. Skin: No rashes, lesions or ulcers. Jaundiced. Psychiatry: Judgement and insight appear diminished. Mood & affect flat.   Data Reviewed:   I have personally reviewed following labs and imaging studies:  Labs: Basic Metabolic Panel:  Recent Labs Lab 04/28/16 1647 04/28/16 1648 04/28/16 2355 04/29/16 0340  NA 128* 125*  --  124*  K 3.3* 3.4*  --  3.2*  CL 89* 84*  --  88*  CO2 27  --   --  23  GLUCOSE 122* 122*  --  100*  BUN 20 27*  --  23*  CREATININE 2.81* 3.00*  --  2.72*  CALCIUM 8.3*  --   --  8.0*  MG 1.3*  --   --  2.0  PHOS  --   --  3.4 3.2   GFR Estimated Creatinine Clearance: 19.6 mL/min (by C-G formula based on SCr of 2.72 mg/dL (H)). Liver Function Tests:  Recent Labs Lab 04/28/16 1647 04/29/16 0340  AST 164* 142*  ALT 90* 81*  ALKPHOS 126 120  BILITOT 5.1* 4.5*  PROT 5.7* 5.2*  ALBUMIN 2.7* 2.6*    Recent Labs Lab 04/28/16 1647 04/29/16 0340  AMMONIA 48* 59*   Coagulation profile  Recent Labs Lab 04/28/16 1647 04/29/16 0340  INR 2.90 2.90    CBC:  Recent Labs Lab 04/28/16 1647 04/28/16 1648 04/29/16 0340 04/29/16 1211  WBC 7.5  --  6.8  --   NEUTROABS 5.8  --   --   --   HGB 7.1* 8.2* 6.4* 7.8*  HCT 19.8* 24.0* 17.9* 22.1*  MCV 96.6  --  95.7  --   PLT 115*  --  103*  --    BNP (last 3 results)  Recent Labs  01/23/16 1521  PROBNP 650.0*   Lipid Profile:  Recent Labs  04/29/16 0340  CHOL 75  HDL 18*  LDLCALC 44  TRIG 67  CHOLHDL 4.2   Thyroid function studies:  Recent Labs  04/29/16 0340  TSH 19.713*   Anemia work up:  Recent Labs  04/29/16 0340  VITAMINB12 2,202*  FOLATE 14.3  FERRITIN 815*  TIBC 113*   IRON 49  RETICCTPCT 3.7*    Microbiology No results found for this or any previous visit (from the past 240 hour(s)).  Radiology: Ct Abdomen Pelvis Wo Contrast  Result Date: 04/28/2016 CLINICAL DATA:  82 year old male with generalized weakness and fall. Jaundice. EXAM: CT ABDOMEN AND PELVIS WITHOUT CONTRAST TECHNIQUE: Multidetector CT imaging of the abdomen and pelvis was performed following the standard protocol without IV contrast. COMPARISON:  Abdominal radiograph dated 04/28/2016 FINDINGS: Evaluation of this exam is limited in the absence of  intravenous contrast. Lower chest: Minimal hazy density in the lingula, likely atelectatic changes. The visualized lung bases are otherwise clear. There is moderate cardiomegaly with coronary vascular calcification. There is hypoattenuation of the cardiac blood pool suggestive of a degree of anemia. Clinical correlation is recommended. No intra-abdominal free air or free fluid. Hepatobiliary: Diffuse fatty infiltration of the liver. A 1 cm ill-defined hypodensity in the left lobe of the liver as well as an ill-defined hypodensity in the right lobe of the liver (series 201 image 22) are indeterminate. Although this may be related to a benign etiology underlying neoplastic process or metastatic disease are not excluded. MRI without and with contrast may provide better evaluation. No intrahepatic biliary ductal dilatation. The gallbladder is predominantly contracted. No calcified gallstone identified. There is mild haziness of the pericholecystic fat. Ultrasound may provide better evaluation of the gallbladder. Pancreas: Unremarkable. No pancreatic ductal dilatation or surrounding inflammatory changes. Spleen: Normal in size without focal abnormality. Adrenals/Urinary Tract: The adrenal glands appear unremarkable. Mild bilateral renal atrophy. There is no hydronephrosis or nephrolithiasis on either side. A 2 cm left renal upper pole hypodense lesion is not well  characterized but demonstrates fluid attenuation and most likely represents a cyst. Ultrasound or MRI may provide better characterisation. The visualized ureters appear unremarkable. There is mild thickening and trabecular appearance of the bladder wall, likely related chronic bladder outlet obstruction. Correlation with urinalysis recommended to exclude UTI. Stomach/Bowel: Oral contrast opacifies the stomach and multiple loops of small bowel traversing into the colon. There is thickened appearance of the gastric wall, likely related to underdistention. Gastritis is less likely. Mild thickening of the jejunal folds in the left hemi abdomen may be related to underdistention. Clinical correlation is recommended to evaluate for enteritis. No evidence of bowel obstruction. Appendectomy. Vascular/Lymphatic: Advanced aortoiliac atherosclerotic disease. Evaluation of the aorta and IVC is limited on this noncontrast study. No portal venous gas identified. There is no adenopathy. Reproductive: The prostate gland is enlarged and heterogeneous with median lobe hypertrophy indenting the base of the bladder. The prostate measures approximately 6.5 cm in transverse diameter. Other: None Musculoskeletal: There is osteopenia with degenerative changes of the spine. Multiple old left posterior rib fractures. No acute fracture. IMPRESSION: Thickened appearance of the stomach and jejunal folds may be related to underdistention or represent gastroenteritis. Clinical correlation is recommended. No bowel obstruction. Minimal pericholecystic haziness. Ultrasound may provide better evaluation of the gallbladder. **An incidental finding of potential clinical significance has been found. Ill-defined hepatic hypodense lesions are not characterized. Nonemergent MRI without and with contrast is recommended for further characterization.** Enlarged prostate gland with evidence of chronic bladder outlet obstruction. Correlation with urinalysis  recommended to exclude superimposed UTI. Electronically Signed   By: Elgie CollardArash  Radparvar M.D.   On: 04/28/2016 23:52   Ct Head Wo Contrast  Result Date: 04/28/2016 CLINICAL DATA:  Generalized weakness.  Multiple recent falls. EXAM: CT HEAD WITHOUT CONTRAST TECHNIQUE: Contiguous axial images were obtained from the base of the skull through the vertex without intravenous contrast. COMPARISON:  04/21/2016 FINDINGS: Brain: Moderate cerebral atrophy is unchanged. Periventricular white matter hypodensities are unchanged and nonspecific but compatible with mild chronic small vessel ischemic disease. There is no evidence of acute cortical infarct, intracranial hemorrhage, mass, midline shift, or extra-axial fluid collection. Vascular: Calcified atherosclerosis at the skullbase. Skull: No fracture. Well-defined 2 cm sclerotic lesion in the right parietal bone adjacent to the sagittal suture is unchanged. Sinuses/Orbits: Unchanged mild right sphenoid sinus mucosal thickening. Clear mastoid air  cells. Prior bilateral cataract extraction. Other: None. IMPRESSION: 1. No evidence of acute intracranial abnormality. 2. Mild chronic small vessel ischemic disease and moderate cerebral atrophy. Electronically Signed   By: Sebastian Ache M.D.   On: 04/28/2016 19:10   Dg Abd Portable 1 View  Result Date: 04/28/2016 CLINICAL DATA:  Generalized weakness. EXAM: PORTABLE ABDOMEN - 1 VIEW COMPARISON:  CT 12/11/2015. FINDINGS: Soft tissue structures are unremarkable. No bowel distention. Nondistended air-filled loops of small and large bowel are noted. Mild thickening of small-bowel loops noted left upper quadrant. Process such enteritis cannot be excluded. Aortoiliac atherosclerotic vascular calcification. IMPRESSION: 1. Mild thickening of left upper quadrant proximal small bowel loops cannot be excluded. A process such as enteritis cannot be completely excluded. Exam otherwise unremarkable. No evidence of free air. 2. Aortoiliac  atherosclerotic vascular disease . Electronically Signed   By: Maisie Fus  Register   On: 04/28/2016 17:14   US Abdomen Limited Ruq  Result Date: 04/29/2016 CLINICAL DATA:  Elevated LFTs EXAM: US ABDOMEN LIMITED - RIGHT UPPER QUADRANT COMPARISON:  04/28/2016 FINDINGS: Gallbladder: No gallstones are noted within gallbladder. No thickening of gallbladder wall. No sonographic Murphy's sign. Common bile duct: Diameter: 6.2 mm in diameter Liver: There is diffuse increased echogenicity of the liver suspicious for fatty infiltration. Complex cyst in right hepatic lobe measures 1.9 x 1.8 cm. Further correlation with MRI is recommended. No intrahepatic biliary ductal dilatation. IMPRESSION: 1. No gallstones are noted within gallbladder. Diffuse increased echogenicity of the liver suspicious for fatty infiltration. Normal CBD. Complex cyst in right hepatic lobe measures 1.9 cm. Further correlation with MRI is recommended. Electronically Signed   By: Natasha Mead M.D.   On: 04/29/2016 10:10    Medications:   . sodium chloride   Intravenous Once  . carvedilol  12.5 mg Oral BID WC  . feeding supplement (ENSURE ENLIVE)  237 mL Oral BID BM  . folic acid  1 mg Oral Daily  . levothyroxine  50 mcg Oral QAC breakfast  . mouth rinse  15 mL Mouth Rinse BID  . multivitamin with minerals  1 tablet Oral Daily  . pramipexole  0.25 mg Oral QHS  . tamsulosin  0.4 mg Oral Daily  . thiamine  100 mg Oral Daily   Or  . thiamine  100 mg Intravenous Daily   Continuous Infusions: . 0.9 % NaCl with KCl 40 mEq / L 75 mL/hr at 04/29/16 1500    Medical decision making is of high complexity and this patient is at high risk of deterioration, therefore this is a level 3 visit.     LOS: 1 day   Loranda Mastel  [facility] Pager [phone]. If unable to reach me by pager, please call my cell phone at [phone].  *Please refer to [email or web address], password [email or web address] to get updated schedule on who will round on this patient, as  hospitalists switch teams weekly. If 7PM-7AM, please contact night-coverage at [email or web address], password [email or web address] for any overnight needs.  04/29/2016, 3:27 PM

## 2016-04-29 NOTE — Evaluation (Signed)
Physical Therapy Evaluation Patient Details Name: Xavier Taylor MRN: [id number] DOB: May 16, 1930 Today's Date: 04/29/2016   History of Present Illness  Xavier is an 82 y/o male with mh of CAD, s/CABG, Alcohol abuse, COPD, admitted to ED with confusion, fatigue and weakness over the last 1.5 weeks, with difficulty walking.  Clinical Impression  Xavier admitted with/for weakness and confusion.  Xavier presently at a min assist level.  Xavier currently limited functionally due to the problems listed. ( See problems list.)   Xavier will benefit from Xavier to maximize function and safety in order to get ready for next venue listed below.     Follow Up Recommendations SNF    Equipment Recommendations  None recommended by Xavier    Recommendations for Other Services       Precautions / Restrictions Precautions Precautions: Fall      Mobility  Bed Mobility Overal bed mobility: Needs Assistance Bed Mobility: Supine to Sit     Supine to sit: Min assist     General bed mobility comments: cues for intiation, stability assist while Xavier coming up and forward  Transfers Overall transfer level: Needs assistance Equipment used: Rolling walker (2 wheeled) Transfers: Sit to/from Stand Sit to Stand: Min assist         General transfer comment: cues for hand placement and stability assist while Xavier's hands transitioning to the RW  Ambulation/Gait Ambulation/Gait assistance: Min assist Ambulation Distance (Feet): 20 Feet Assistive device: Rolling walker (2 wheeled) Gait Pattern/deviations: Step-through pattern;Decreased step length - right;Decreased step length - left;Decreased stride length   Gait velocity interpretation: Below normal speed for age/gender General Gait Details: slow, weak gait with some difficulty clearing through swing.  Stairs            Wheelchair Mobility    Modified Rankin (Stroke Patients Only)       Balance Overall balance assessment: Needs assistance   Sitting  balance-Leahy Scale: Fair Sitting balance - Comments: held posture through MMT at EOB     Standing balance-Leahy Scale: Poor Standing balance comment: reliant on UE's                             Pertinent Vitals/Pain      Home Living Family/patient expects to be discharged to:: Private residence Living Arrangements: Other relatives Available Help at Discharge: Family Type of Home: Independent living facility Home Access: Level entry     Home Layout: One level Home Equipment: Art gallery managerlectric scooter;Wheelchair - manual;Walker - 4 wheels;Grab bars - tub/shower;Grab bars - toilet;Shower seat - built in      Prior Function Level of Independence: Needs assistance   Gait / Transfers Assistance Needed: Has needed A to get up and down for the last week           Hand Dominance   Dominant Hand: Right    Extremity/Trunk Assessment   Upper Extremity Assessment: Defer to OT evaluation           Lower Extremity Assessment: Overall WFL for tasks assessed (proximal weakness at hip flexors and hams)      Cervical / Trunk Assessment:  (some truncal weakness seen during MMT)  Communication   Communication: No difficulties  Cognition Arousal/Alertness: Awake/alert Behavior During Therapy: WFL for tasks assessed/performed Overall Cognitive Status: Within Functional Limits for tasks assessed (but relayed some home information inccorrectly)  General Comments      Exercises     Assessment/Plan    Xavier Assessment Patient needs continued Xavier services  Xavier Problem List Decreased strength;Decreased activity tolerance;Decreased balance;Decreased mobility;Decreased knowledge of use of DME          Xavier Treatment Interventions Gait training;DME instruction;Functional mobility training;Therapeutic activities;Balance training;Patient/family education    Xavier Goals (Current goals can be found in the Care Plan section)  Acute Rehab Xavier Goals Patient  Stated Goal: Xavier did not state, family needs for him to get back some strength and conditioning. Xavier Goal Formulation: With patient Time For Goal Achievement: 05/06/16 Potential to Achieve Goals: Fair    Frequency Min 3X/week   Barriers to discharge        Co-evaluation Xavier/OT/SLP Co-Evaluation/Treatment: Yes Reason for Co-Treatment: For patient/therapist safety (related that Xavier was 2 person assist) Xavier goals addressed during session: Mobility/safety with mobility         End of Session   Activity Tolerance: Patient tolerated treatment well;Patient limited by fatigue Patient left: in chair;with call bell/phone within reach;with chair alarm set;with family/visitor present Nurse Communication: Mobility status         Time: 1610-9604 Xavier Time Calculation (min) (ACUTE ONLY): 30 min   Charges:   Xavier Evaluation $Xavier Eval Moderate Complexity: 1 Procedure     Xavier G Codes:        Xavier Taylor, Xavier Taylor 04/29/2016, 1:19 PM 04/29/2016  Xavier Taylor, Xavier Taylor  (pager)

## 2016-04-29 NOTE — ED Provider Notes (Signed)
[facility]-EMERGENCY DEPT Provider Note   CSN: [id number] Arrival date & time: 04/28/16  1540     History   Chief Complaint Chief Complaint  Patient presents with  . Weakness    HPI Xavier Taylor is a 82 y.o. male.  HPI   82yo male with history of CAD s/p CABG, etoh use, atrial fibrillation on eliquis, CKD, recent fall 1 week ago, presents with concern for severe fatigue, generalized weakness, bleeding from elbow.  Initially hypotensive on presentation to ED however this quickly improved.  Possible history of "small amount of blood' in toilet bowl per caregiver, however pt with yellow stool hemoccult negative on rectal exam.  Severe fatigue. No vomiting. Fell last week. Diarrhea recently.  Past Medical History:  Diagnosis Date  . A-fib (HCC)   . Acute liver failure 04/28/2016  . Alcoholic liver disease (HCC) 04/28/2016  . Ascites   . BPH (benign prostatic hypertrophy)   . CAD (coronary artery disease)   . Chronic renal disease, stage III   . COPD (chronic obstructive pulmonary disease) (HCC)   . Coronary artery disease   . Decreased GFR   . GERD with stricture   . Hypertension   . Hypothyroid   . Internal hemorrhoids   . Renal disorder   . S/P CABG x 5   . Thrombocytopenia (HCC) 04/29/2016    Patient Active Problem List   Diagnosis Date Noted  . Coagulopathy (HCC) 04/29/2016  . Thrombocytopenia (HCC) 04/29/2016  . Hypothyroidism 04/29/2016  . Acute renal failure superimposed on stage 3 chronic kidney disease (HCC) 04/29/2016  . Anemia 04/28/2016  . Hyponatremia 04/28/2016  . Hypokalemia 04/28/2016  . Alcoholic liver disease (HCC) 04/28/2016  . Hypomagnesemia 04/28/2016  . COPD (chronic obstructive pulmonary disease) (HCC) 04/28/2016  . Alcohol abuse 04/28/2016  . Acute liver failure 04/28/2016  . Asbestosis (HCC) 01/23/2016  . Dyspnea 01/23/2016  . CAD (coronary artery disease) 03/29/2015  . Atrial fibrillation, chronic (HCC) 03/29/2015    Past Surgical  History:  Procedure Laterality Date  . APPENDECTOMY    . BACK SURGERY    . CARDIAC SURGERY         Home Medications    Prior to Admission medications   Medication Sig Start Date End Date Taking? Authorizing Provider  carvedilol (COREG) 12.5 MG tablet Take 12.5 mg by mouth 2 (two) times daily with a meal.   Yes Historical Provider, MD  ELIQUIS 2.5 MG TABS tablet Take 2.5 mg by mouth daily.  02/21/16  Yes Historical Provider, MD  fluticasone (FLONASE) 50 MCG/ACT nasal spray Place 2 sprays into the nose daily.    Yes Historical Provider, MD  furosemide (LASIX) 40 MG tablet Take 40 mg by mouth daily.   Yes Historical Provider, MD  levothyroxine (SYNTHROID, LEVOTHROID) 50 MCG tablet Take 50 mcg by mouth daily.   Yes Historical Provider, MD  Multiple Vitamin (MULTIVITAMIN WITH MINERALS) TABS tablet Take 1 tablet by mouth daily.   Yes Historical Provider, MD  omeprazole (PRILOSEC) 20 MG capsule Take 20 mg by mouth daily.    Yes Historical Provider, MD  pramipexole (MIRAPEX) 0.25 MG tablet Take 0.25 mg by mouth at bedtime.   Yes Historical Provider, MD  simvastatin (ZOCOR) 10 MG tablet Take 10 mg by mouth daily at 6 PM.    Yes Historical Provider, MD  Tamsulosin HCl (FLOMAX) 0.4 MG CAPS Take 0.4 mg by mouth daily.   Yes Historical Provider, MD  chlorproMAZINE (THORAZINE) 25 MG tablet Take 25 mg  by mouth 3 (three) times daily as needed for hiccoughs.     Historical Provider, MD  potassium chloride SA (K-DUR,KLOR-CON) 20 MEQ tablet Take 1 tablet (20 mEq total) by mouth 2 (two) times daily. Patient not taking: Reported on 04/28/2016 03/13/16   Elpidio AnisShari Upstill, PA-C    Family History Family History  Problem Relation Age of Onset  . Heart attack Father 2760  . Diabetes Son   . Diabetes Son   . Heart attack Son 4453  . Breast cancer Daughter   . Rheum arthritis Mother     Social History Social History  Substance Use Topics  . Smoking status: Former Smoker    Packs/day: 2.00    Years: 40.00     Types: Cigarettes    Quit date: 07/07/1993  . Smokeless tobacco: Former NeurosurgeonUser  . Alcohol use 16.8 oz/week    28 Shots of liquor per week     Allergies   Exelon [rivastigmine]   Review of Systems Review of Systems  Constitutional: Positive for fatigue. Negative for fever.  HENT: Negative for sore throat.   Eyes: Negative for visual disturbance.  Respiratory: Negative for shortness of breath.   Cardiovascular: Negative for chest pain.  Gastrointestinal: Positive for diarrhea. Negative for abdominal pain, blood in stool (family says not today but that it had been seen by caregivers) and constipation.  Genitourinary: Negative for difficulty urinating.  Musculoskeletal: Negative for back pain and neck stiffness.  Skin: Positive for wound. Negative for rash.  Neurological: Negative for syncope and headaches.     Physical Exam Updated Vital Signs BP 135/79   Pulse 60   Temp 97.8 F (36.6 C)   Resp 18   Ht 5\' 8"  (1.727 m)   Wt 155 lb 14.4 oz (70.7 kg)   SpO2 94%   BMI 23.70 kg/m   Physical Exam  Constitutional: He is oriented to person, place, and time. He appears well-developed and well-nourished. No distress.  HENT:  Head: Normocephalic and atraumatic.  Eyes: Conjunctivae and EOM are normal. Scleral icterus is present.  Pale conjunctiva  Neck: Normal range of motion.  Cardiovascular: Normal rate, regular rhythm, normal heart sounds and intact distal pulses.  Exam reveals no gallop and no friction rub.   No murmur heard. Pulmonary/Chest: Effort normal and breath sounds normal. No respiratory distress. He has no wheezes. He has no rales.  Abdominal: Soft. He exhibits no distension. There is no tenderness. There is no guarding.  No bruising   Genitourinary: Rectum normal. Rectal exam shows guaiac negative stool (yellow-brown stool).  Musculoskeletal: He exhibits no edema.  Neurological: He is alert and oriented to person, place, and time.  Skin: Skin is warm and dry. He is  not diaphoretic. There is pallor (and jaundice).  1cm right elbow laceration, small amt active bleeding  Nursing note and vitals reviewed.    ED Treatments / Results  Labs (all labs ordered are listed, but only abnormal results are displayed) Labs Reviewed  CBC WITH DIFFERENTIAL/PLATELET - Abnormal; Notable for the following:       Result Value   RBC 2.05 (*)    Hemoglobin 7.1 (*)    HCT 19.8 (*)    MCH 34.6 (*)    Platelets 115 (*)    All other components within normal limits  COMPREHENSIVE METABOLIC PANEL - Abnormal; Notable for the following:    Sodium 128 (*)    Potassium 3.3 (*)    Chloride 89 (*)    Glucose, Bld  122 (*)    Creatinine, Ser 2.81 (*)    Calcium 8.3 (*)    Total Protein 5.7 (*)    Albumin 2.7 (*)    AST 164 (*)    ALT 90 (*)    Total Bilirubin 5.1 (*)    GFR calc non Af Amer 19 (*)    GFR calc Af Amer 22 (*)    All other components within normal limits  AMMONIA - Abnormal; Notable for the following:    Ammonia 48 (*)    All other components within normal limits  URINALYSIS, ROUTINE W REFLEX MICROSCOPIC (NOT AT [facility]) - Abnormal; Notable for the following:    Color, Urine AMBER (*)    Bilirubin Urine SMALL (*)    All other components within normal limits  PROTIME-INR - Abnormal; Notable for the following:    Prothrombin Time 30.9 (*)    All other components within normal limits  MAGNESIUM - Abnormal; Notable for the following:    Magnesium 1.3 (*)    All other components within normal limits  TSH - Abnormal; Notable for the following:    TSH 19.713 (*)    All other components within normal limits  PROTIME-INR - Abnormal; Notable for the following:    Prothrombin Time 30.9 (*)    All other components within normal limits  COMPREHENSIVE METABOLIC PANEL - Abnormal; Notable for the following:    Sodium 124 (*)    Potassium 3.2 (*)    Chloride 88 (*)    Glucose, Bld 100 (*)    BUN 23 (*)    Creatinine, Ser 2.72 (*)    Calcium 8.0 (*)    Total  Protein 5.2 (*)    Albumin 2.6 (*)    AST 142 (*)    ALT 81 (*)    Total Bilirubin 4.5 (*)    GFR calc non Af Amer 20 (*)    GFR calc Af Amer 23 (*)    All other components within normal limits  CBC - Abnormal; Notable for the following:    RBC 1.87 (*)    Hemoglobin 6.4 (*)    HCT 17.9 (*)    MCH 34.2 (*)    Platelets 103 (*)    All other components within normal limits  AMMONIA - Abnormal; Notable for the following:    Ammonia 59 (*)    All other components within normal limits  VITAMIN B12 - Abnormal; Notable for the following:    Vitamin B-12 2,202 (*)    All other components within normal limits  IRON AND TIBC - Abnormal; Notable for the following:    TIBC 113 (*)    Saturation Ratios 43 (*)    All other components within normal limits  FERRITIN - Abnormal; Notable for the following:    Ferritin 815 (*)    All other components within normal limits  RETICULOCYTES - Abnormal; Notable for the following:    Retic Ct Pct 3.7 (*)    RBC. 1.87 (*)    All other components within normal limits  LIPID PANEL - Abnormal; Notable for the following:    HDL 18 (*)    All other components within normal limits  HEMOGLOBIN AND HEMATOCRIT, BLOOD - Abnormal; Notable for the following:    Hemoglobin 7.8 (*)    HCT 22.1 (*)    All other components within normal limits  OSMOLALITY, URINE - Abnormal; Notable for the following:    Osmolality, Ur 270 (*)  All other components within normal limits  I-STAT CHEM 8, ED - Abnormal; Notable for the following:    Sodium 125 (*)    Potassium 3.4 (*)    Chloride 84 (*)    BUN 27 (*)    Creatinine, Ser 3.00 (*)    Glucose, Bld 122 (*)    Calcium, Ion 0.94 (*)    Hemoglobin 8.2 (*)    HCT 24.0 (*)    All other components within normal limits  GASTROINTESTINAL PANEL BY PCR, STOOL (REPLACES STOOL CULTURE)  C DIFFICILE QUICK SCREEN W PCR REFLEX  PHOSPHORUS  CREATININE, URINE, RANDOM  SODIUM, URINE, RANDOM  MAGNESIUM  PHOSPHORUS  FOLATE    HEPATITIS PANEL, ACUTE  FECAL LACTOFERRIN, QUANT  BASIC METABOLIC PANEL  POC OCCULT BLOOD, ED  TYPE AND SCREEN  PREPARE RBC (CROSSMATCH)  ABO/RH  PREPARE RBC (CROSSMATCH)  PREPARE FRESH FROZEN PLASMA    EKG  EKG Interpretation None       Radiology Ct Abdomen Pelvis Wo Contrast  Result Date: 04/28/2016 CLINICAL DATA:  82 year old male with generalized weakness and fall. Jaundice. EXAM: CT ABDOMEN AND PELVIS WITHOUT CONTRAST TECHNIQUE: Multidetector CT imaging of the abdomen and pelvis was performed following the standard protocol without IV contrast. COMPARISON:  Abdominal radiograph dated 04/28/2016 FINDINGS: Evaluation of this exam is limited in the absence of intravenous contrast. Lower chest: Minimal hazy density in the lingula, likely atelectatic changes. The visualized lung bases are otherwise clear. There is moderate cardiomegaly with coronary vascular calcification. There is hypoattenuation of the cardiac blood pool suggestive of a degree of anemia. Clinical correlation is recommended. No intra-abdominal free air or free fluid. Hepatobiliary: Diffuse fatty infiltration of the liver. A 1 cm ill-defined hypodensity in the left lobe of the liver as well as an ill-defined hypodensity in the right lobe of the liver (series 201 image 22) are indeterminate. Although this may be related to a benign etiology underlying neoplastic process or metastatic disease are not excluded. MRI without and with contrast may provide better evaluation. No intrahepatic biliary ductal dilatation. The gallbladder is predominantly contracted. No calcified gallstone identified. There is mild haziness of the pericholecystic fat. Ultrasound may provide better evaluation of the gallbladder. Pancreas: Unremarkable. No pancreatic ductal dilatation or surrounding inflammatory changes. Spleen: Normal in size without focal abnormality. Adrenals/Urinary Tract: The adrenal glands appear unremarkable. Mild bilateral renal  atrophy. There is no hydronephrosis or nephrolithiasis on either side. A 2 cm left renal upper pole hypodense lesion is not well characterized but demonstrates fluid attenuation and most likely represents a cyst. Ultrasound or MRI may provide better characterisation. The visualized ureters appear unremarkable. There is mild thickening and trabecular appearance of the bladder wall, likely related chronic bladder outlet obstruction. Correlation with urinalysis recommended to exclude UTI. Stomach/Bowel: Oral contrast opacifies the stomach and multiple loops of small bowel traversing into the colon. There is thickened appearance of the gastric wall, likely related to underdistention. Gastritis is less likely. Mild thickening of the jejunal folds in the left hemi abdomen may be related to underdistention. Clinical correlation is recommended to evaluate for enteritis. No evidence of bowel obstruction. Appendectomy. Vascular/Lymphatic: Advanced aortoiliac atherosclerotic disease. Evaluation of the aorta and IVC is limited on this noncontrast study. No portal venous gas identified. There is no adenopathy. Reproductive: The prostate gland is enlarged and heterogeneous with median lobe hypertrophy indenting the base of the bladder. The prostate measures approximately 6.5 cm in transverse diameter. Other: None Musculoskeletal: There is osteopenia with degenerative changes  of the spine. Multiple old left posterior rib fractures. No acute fracture. IMPRESSION: Thickened appearance of the stomach and jejunal folds may be related to underdistention or represent gastroenteritis. Clinical correlation is recommended. No bowel obstruction. Minimal pericholecystic haziness. Ultrasound may provide better evaluation of the gallbladder. **An incidental finding of potential clinical significance has been found. Ill-defined hepatic hypodense lesions are not characterized. Nonemergent MRI without and with contrast is recommended for further  characterization.** Enlarged prostate gland with evidence of chronic bladder outlet obstruction. Correlation with urinalysis recommended to exclude superimposed UTI. Electronically Signed   By: Elgie Collard M.D.   On: 04/28/2016 23:52   Ct Head Wo Contrast  Result Date: 04/28/2016 CLINICAL DATA:  Generalized weakness.  Multiple recent falls. EXAM: CT HEAD WITHOUT CONTRAST TECHNIQUE: Contiguous axial images were obtained from the base of the skull through the vertex without intravenous contrast. COMPARISON:  04/21/2016 FINDINGS: Brain: Moderate cerebral atrophy is unchanged. Periventricular white matter hypodensities are unchanged and nonspecific but compatible with mild chronic small vessel ischemic disease. There is no evidence of acute cortical infarct, intracranial hemorrhage, mass, midline shift, or extra-axial fluid collection. Vascular: Calcified atherosclerosis at the skullbase. Skull: No fracture. Well-defined 2 cm sclerotic lesion in the right parietal bone adjacent to the sagittal suture is unchanged. Sinuses/Orbits: Unchanged mild right sphenoid sinus mucosal thickening. Clear mastoid air cells. Prior bilateral cataract extraction. Other: None. IMPRESSION: 1. No evidence of acute intracranial abnormality. 2. Mild chronic small vessel ischemic disease and moderate cerebral atrophy. Electronically Signed   By: Sebastian Ache M.D.   On: 04/28/2016 19:10   Dg Abd Portable 1 View  Result Date: 04/28/2016 CLINICAL DATA:  Generalized weakness. EXAM: PORTABLE ABDOMEN - 1 VIEW COMPARISON:  CT 12/11/2015. FINDINGS: Soft tissue structures are unremarkable. No bowel distention. Nondistended air-filled loops of small and large bowel are noted. Mild thickening of small-bowel loops noted left upper quadrant. Process such enteritis cannot be excluded. Aortoiliac atherosclerotic vascular calcification. IMPRESSION: 1. Mild thickening of left upper quadrant proximal small bowel loops cannot be excluded. A  process such as enteritis cannot be completely excluded. Exam otherwise unremarkable. No evidence of free air. 2. Aortoiliac atherosclerotic vascular disease . Electronically Signed   By: Maisie Fus  Register   On: 04/28/2016 17:14   US Abdomen Limited Ruq  Result Date: 04/29/2016 CLINICAL DATA:  Elevated LFTs EXAM: US ABDOMEN LIMITED - RIGHT UPPER QUADRANT COMPARISON:  04/28/2016 FINDINGS: Gallbladder: No gallstones are noted within gallbladder. No thickening of gallbladder wall. No sonographic Murphy's sign. Common bile duct: Diameter: 6.2 mm in diameter Liver: There is diffuse increased echogenicity of the liver suspicious for fatty infiltration. Complex cyst in right hepatic lobe measures 1.9 x 1.8 cm. Further correlation with MRI is recommended. No intrahepatic biliary ductal dilatation. IMPRESSION: 1. No gallstones are noted within gallbladder. Diffuse increased echogenicity of the liver suspicious for fatty infiltration. Normal CBD. Complex cyst in right hepatic lobe measures 1.9 cm. Further correlation with MRI is recommended. Electronically Signed   By: Natasha Mead M.D.   On: 04/29/2016 10:10    Procedures Procedures (including critical care time)  Medications Ordered in ED Medications  iopamidol (ISOVUE-300) 61 % injection (not administered)  pramipexole (MIRAPEX) tablet 0.25 mg (not administered)  carvedilol (COREG) tablet 12.5 mg (12.5 mg Oral Given 04/29/16 1744)  levothyroxine (SYNTHROID, LEVOTHROID) tablet 50 mcg (50 mcg Oral Given 04/29/16 0809)  tamsulosin (FLOMAX) capsule 0.4 mg (0.4 mg Oral Given 04/29/16 1024)  LORazepam (ATIVAN) tablet 1 mg (not administered)  Or  LORazepam (ATIVAN) injection 1 mg (not administered)  thiamine (VITAMIN B-1) tablet 100 mg (100 mg Oral Given 04/29/16 1024)    Or  thiamine (B-1) injection 100 mg ( Intravenous See Alternative 04/29/16 1024)  folic acid (FOLVITE) tablet 1 mg (1 mg Oral Given 04/29/16 1024)  multivitamin with minerals tablet 1  tablet (1 tablet Oral Given 04/29/16 1024)  ondansetron (ZOFRAN) tablet 4 mg (not administered)    Or  ondansetron (ZOFRAN) injection 4 mg (not administered)  albuterol (PROVENTIL) (2.5 MG/3ML) 0.083% nebulizer solution 2.5 mg (not administered)  0.9 % NaCl with KCl 40 mEq / L  infusion ( Intravenous Rate/Dose Verify 04/29/16 1500)  MEDLINE mouth rinse (15 mLs Mouth Rinse Given 04/29/16 1330)  feeding supplement (BOOST / RESOURCE BREEZE) liquid 1 Container (not administered)  sodium chloride 0.9 % bolus 1,000 mL (0 mLs Intravenous Stopped 04/28/16 1723)  0.9 %  sodium chloride infusion ( Intravenous Stopped 04/29/16 0805)  diphenhydrAMINE (BENADRYL) capsule 25 mg (25 mg Oral Given 04/29/16 0435)  magnesium sulfate IVPB 2 g 50 mL (2 g Intravenous Given 04/29/16 0112)  potassium chloride 10 mEq in 100 mL IVPB (10 mEq Intravenous Given 04/29/16 0304)  0.9 %  sodium chloride infusion ( Intravenous New Bag/Given 04/29/16 1600)  phytonadione (VITAMIN K) oral solution 1 mg/0.5 mL (1 mg Oral Given 04/29/16 1721)     Initial Impression / Assessment and Plan / ED Course  I have reviewed the triage vital signs and the nursing notes.  Pertinent labs & imaging results that were available during my care of the patient were reviewed by me and considered in my medical decision making (see chart for details).  Clinical Course   82yo male with history of CAD s/p CABG, etoh use, atrial fibrillation on eliquis, CKD, recent fall 1 week ago, presents with concern for severe fatigue, generalized weakness, bleeding from elbow.  Initially hypotensive on presentation to ED however this quickly improved.  Possible history of "small amount of blood' in toilet bowl per caregiver, however pt with yellow stool hemoccult negative on rectal exam.  No sign of abdominal trauma or tenderness to suggest retroperitoneal bleed or other intraabdominal bleed.  No fevers or infectious symptoms to suggest sepsis.  Head CT done shows  no sign of abnormality.   Labs significant for acute on chronic renal disease and hepatic failure with INR 2.9, bilirubin 5. Pt also with hyponatremia, Hgb 7.1. Bleeding from elbow controlled with pressure bandage and no other signs of bleeding and will continue to monitor. Type and Screen sent.    Will admit to Dr. Adela Glimpse for further care. CT abdomen pelvis ordered for further evaluation.   Final Clinical Impressions(s) / ED Diagnoses   Final diagnoses:  Acute kidney injury superimposed on chronic kidney disease (HCC)  Hyponatremia  Acute liver failure without hepatic coma  Elevated bilirubin  Elevated INR  Anemia, unspecified type    New Prescriptions Current Discharge Medication List       Alvira Monday, MD 04/29/16 1749

## 2016-04-29 NOTE — Evaluation (Signed)
Occupational Therapy Evaluation Patient Details Name: Xavier Taylor MRN: [id number] DOB: May 10, 1930 Today's Date: 04/29/2016    History of Present Illness pt is an 82 y/o male with mh of CAD, s/CABG, Alcohol abuse, COPD, admitted to ED with confusion, fatigue and weakness over the last 1.5 weeks, with difficulty walking.   Clinical Impression   This 82 yo male admitted with above presents to acute OT with deficits below (see OT problem list) thus affecting his level of independence as of 1 month ago. He will benefit from acute OT with follow up OT at SNF to work on increasing independence with basic ADLs.    Follow Up Recommendations  SNF    Equipment Recommendations  Other (comment) (TBD at next venue)       Precautions / Restrictions Precautions Precautions: Fall Restrictions Weight Bearing Restrictions: No      Mobility Bed Mobility Overal bed mobility: Needs Assistance Bed Mobility: Supine to Sit     Supine to sit: Min assist     General bed mobility comments: cues for intiation, stability assist while pt coming up and forward  Transfers Overall transfer level: Needs assistance Equipment used: Rolling walker (2 wheeled) Transfers: Sit to/from Stand Sit to Stand: Min assist         General transfer comment: cues for hand placement and stability assist while pt's hands transitioning to the RW    Balance Overall balance assessment: Needs assistance Sitting-balance support: No upper extremity supported;Feet supported Sitting balance-Leahy Scale: Fair Sitting balance - Comments: held posture through MMT at EOB     Standing balance-Leahy Scale: Poor Standing balance comment: reliant on UEs                            ADL Overall ADL's : Needs assistance/impaired Eating/Feeding: Independent;Sitting   Grooming: Set up;Supervision/safety;Sitting   Upper Body Bathing: Minimal assitance;Sitting   Lower Body Bathing: Total assistance (min A  sit<>stand)   Upper Body Dressing : Moderate assistance;Sitting   Lower Body Dressing: Total assistance (min A sit<>stand)   Toilet Transfer: Minimal assistance;Ambulation;RW Toilet Transfer Details (indicate cue type and reason): bed>door and back to recliner Toileting- Clothing Manipulation and Hygiene: Moderate assistance (min A sit<>stand)                         Pertinent Vitals/Pain Pain Assessment: No/denies pain     Hand Dominance Right   Extremity/Trunk Assessment Upper Extremity Assessment Upper Extremity Assessment: Overall WFL for tasks assessed   Lower Extremity Assessment Lower Extremity Assessment: Overall WFL for tasks assessed (proximal weakness at hip flexors and hams)   Cervical / Trunk Assessment Cervical / Trunk Assessment:  (some truncal weakness seen during MMT)   Communication Communication Communication: No difficulties   Cognition Arousal/Alertness: Awake/alert Behavior During Therapy: WFL for tasks assessed/performed Overall Cognitive Status: Impaired/Different from baseline (pt stated some home information incorrectly)                                Home Living Family/patient expects to be discharged to:: Skilled nursing facility Living Arrangements: Other relatives (grandson) Available Help at Discharge: Family Type of Home: Independent living facility Home Access: Level entry     Home Layout: One level     Bathroom Shower/Tub: Producer, television/film/video: Handicapped height     Home Equipment: Art gallery manager;Wheelchair -  manual;Walker - 4 wheels;Grab bars - tub/shower;Grab bars - toilet;Shower seat - built in          Prior Functioning/Environment Level of Independence: Needs assistance  Gait / Transfers Assistance Needed: Has needed A to get up and down for the last week ADL's / Homemaking Assistance Needed: Has needed A for basic ADLs for last month            OT Problem List: Decreased  strength;Impaired balance (sitting and/or standing);Decreased cognition   OT Treatment/Interventions: Self-care/ADL training;Therapeutic exercise;Therapeutic activities;Patient/family education;DME and/or AE instruction;Balance training    OT Goals(Current goals can be found in the care plan section) Acute Rehab OT Goals Patient Stated Goal: pt did not state, family needs for him to get back some strength and go to ALF post SNF stay OT Goal Formulation: With patient/family  OT Frequency: Min 2X/week   Barriers to D/C: Decreased caregiver support          Co-evaluation PT/OT/SLP Co-Evaluation/Treatment: Yes Reason for Co-Treatment: For patient/therapist safety (RN had [facility] PT goals addressed during session: Mobility/safety with mobility OT goals addressed during session: ADL's and self-care;Strengthening/ROM      End of Session Equipment Utilized During Treatment: Gait belt;Rolling walker  Activity Tolerance: Patient tolerated treatment well Patient left: in chair;with call bell/phone within reach;with chair alarm set;with family/visitor present   Time: 1217-1250 OT Time Calculation (min): 33 min Charges:  OT General Charges $OT Visit: 1 Procedure OT Evaluation $OT Eval Moderate Complexity: 1 Procedure  Evette GeorgesLeonard, [name] [phone] 04/29/2016, 4:46 PM

## 2016-04-29 NOTE — Consult Note (Signed)
[facility] CONSULT Reason for consult: profound anemia and abnormal liver test Referring Physician: [facility]. PCP: Dr. Hal Stoneking. Primary G.I.:   Xavier Taylor is an 82 y.o. male.  HPI: he has a long history of drinking for over 50 years and according to the daughter has been drinking quite heavily for the past 6 to 8 months bottle of Southern comfort a day or possibly more. He has been getting progressively confused and when he has stopped drinking has become quite agitated and shaky. He was feeling considerably weak and was brought to the emergency room where he was found to have abnormal liver test with slight elevation of AST and ALT with albumin level 2.6 and total bilirubin of 4.5. INR was markedly elevated it to .9 with PT 30.9. The patient is not on Coumadin but in fact has been on Eliquis for atrial fibrillation. Stool was negative here in the hospital but hemoglobin was 6.4. Plalet count was 103. CT scan showed marked fatty liver and marked thickening of the stomach that was felt to be due to under distention. Ultrasound showed marked fatty liver but no gallstones. The daughter notes that the patient and his wife have moved up. Recently from [place] because of declining mental status and drinking issue. We did receive records from gastroenterologist in [place] who has followed him for GERD. He underwent EGD 2012 with gastritis and esophagitis but no varices noted in colonoscopy 2013 it was basically negative. He had ultrasounds done in [place] last 2013 with fatty liver noted with small amount of ascites. The patient's family is quite concerned about his drinking. The patient indicates to me that he would like to stop drinking. It appears from his medication list that he is currently taking omeprazole as well as Eliquis. He has not had EGD or colonoscopy since those done in [place] in 2013.  Past Medical History:  Diagnosis Date  . A-fib (HCC)   . Ascites   . BPH  (benign prostatic hypertrophy)   . CAD (coronary artery disease)   . Chronic renal disease, stage III   . COPD (chronic obstructive pulmonary disease) (HCC)   . Coronary artery disease   . Decreased GFR   . GERD with stricture   . Hypertension   . Hypothyroid   . Internal hemorrhoids   . Renal disorder   . S/P CABG x 5     Past Surgical History:  Procedure Laterality Date  . APPENDECTOMY    . BACK SURGERY    . CARDIAC SURGERY      Family History  Problem Relation Age of Onset  . Heart attack Father 60  . Diabetes Son   . Diabetes Son   . Heart attack Son 53  . Breast cancer Daughter   . Rheum arthritis Mother     Social History:  reports that he quit smoking about 22 years ago. His smoking use included Cigarettes. He has a 80.00 pack-year smoking history. He has quit using smokeless tobacco. He reports that he drinks about 16.8 oz of alcohol per week . His drug history is not on file.  Allergies:  Allergies  Allergen Reactions  . Exelon [Rivastigmine] Other (See Comments)    Didn't work for patient    Medications; Prior to Admission medications   Medication Sig Start Date End Date Taking? Authorizing Provider  carvedilol (COREG) 12.5 MG tablet Take 12.5 mg by mouth 2 (two) times daily with a meal.   Yes Historical Provider, MD    ELIQUIS 2.5 MG TABS tablet Take 2.5 mg by mouth daily.  02/21/16  Yes Historical Provider, MD  fluticasone (FLONASE) 50 MCG/ACT nasal spray Place 2 sprays into the nose daily.    Yes Historical Provider, MD  furosemide (LASIX) 40 MG tablet Take 40 mg by mouth daily.   Yes Historical Provider, MD  levothyroxine (SYNTHROID, LEVOTHROID) 50 MCG tablet Take 50 mcg by mouth daily.   Yes Historical Provider, MD  Multiple Vitamin (MULTIVITAMIN WITH MINERALS) TABS tablet Take 1 tablet by mouth daily.   Yes Historical Provider, MD  omeprazole (PRILOSEC) 20 MG capsule Take 20 mg by mouth daily.    Yes Historical Provider, MD  pramipexole (MIRAPEX) 0.25 MG  tablet Take 0.25 mg by mouth at bedtime.   Yes Historical Provider, MD  simvastatin (ZOCOR) 10 MG tablet Take 10 mg by mouth daily at 6 PM.    Yes Historical Provider, MD  Tamsulosin HCl (FLOMAX) 0.4 MG CAPS Take 0.4 mg by mouth daily.   Yes Historical Provider, MD  chlorproMAZINE (THORAZINE) 25 MG tablet Take 25 mg by mouth 3 (three) times daily as needed for hiccoughs.     Historical Provider, MD  potassium chloride SA (K-DUR,KLOR-CON) 20 MEQ tablet Take 1 tablet (20 mEq total) by mouth 2 (two) times daily. Patient not taking: Reported on 04/28/2016 03/13/16   Shari Upstill, PA-C   . sodium chloride   Intravenous Once  . carvedilol  12.5 mg Oral BID WC  . feeding supplement (ENSURE ENLIVE)  237 mL Oral BID BM  . folic acid  1 mg Oral Daily  . levothyroxine  50 mcg Oral QAC breakfast  . mouth rinse  15 mL Mouth Rinse BID  . multivitamin with minerals  1 tablet Oral Daily  . pramipexole  0.25 mg Oral QHS  . tamsulosin  0.4 mg Oral Daily  . thiamine  100 mg Oral Daily   Or  . thiamine  100 mg Intravenous Daily   PRN Meds albuterol, LORazepam **OR** LORazepam, ondansetron **OR** ondansetron (ZOFRAN) IV Results for orders placed or performed during the hospital encounter of 04/28/16 (from the past 48 hour(s))  Osmolality, urine     Status: Abnormal   Collection Time: 04/28/16 10:25 AM  Result Value Ref Range   Osmolality, Ur 270 (L) 300 - 900 mOsm/kg  Type and screen     Status: None (Preliminary result)   Collection Time: 04/28/16  4:30 PM  Result Value Ref Range   ABO/RH(D) A POS    Antibody Screen NEG    Sample Expiration 05/01/2016    Unit Number [id number]    Blood Component Type RED CELLS,LR    Unit division 00    Status of Unit ISSUED    Transfusion Status OK TO TRANSFUSE    Crossmatch Result Compatible    Unit Number [id number]    Blood Component Type RED CELLS,LR    Unit division 00    Status of Unit ALLOCATED    Transfusion Status OK TO TRANSFUSE    Crossmatch  Result Compatible   ABO/Rh     Status: None   Collection Time: 04/28/16  4:30 PM  Result Value Ref Range   ABO/RH(D) A POS   CBC with Differential     Status: Abnormal   Collection Time: 04/28/16  4:47 PM  Result Value Ref Range   WBC 7.5 4.0 - 10.5 K/uL   RBC 2.05 (L) 4.22 - 5.81 MIL/uL   Hemoglobin 7.1 (L) 13.0 - 17.0 g/dL     HCT 19.8 (L) 39.0 - 52.0 %   MCV 96.6 78.0 - 100.0 fL   MCH 34.6 (H) 26.0 - 34.0 pg   MCHC 35.9 30.0 - 36.0 g/dL   RDW 15.3 11.5 - 15.5 %   Platelets 115 (L) 150 - 400 K/uL    Comment: REPEATED TO VERIFY SPECIMEN CHECKED FOR CLOTS PLATELETS APPEAR DECREASED CONSISTENT WITH PREVIOUS RESULT    Neutrophils Relative % 77 %   Neutro Abs 5.8 1.7 - 7.7 K/uL   Lymphocytes Relative 16 %   Lymphs Abs 1.2 0.7 - 4.0 K/uL   Monocytes Relative 6 %   Monocytes Absolute 0.5 0.1 - 1.0 K/uL   Eosinophils Relative 0 %   Eosinophils Absolute 0.0 0.0 - 0.7 K/uL   Basophils Relative 0 %   Basophils Absolute 0.0 0.0 - 0.1 K/uL  Comprehensive metabolic panel     Status: Abnormal   Collection Time: 04/28/16  4:47 PM  Result Value Ref Range   Sodium 128 (L) 135 - 145 mmol/L   Potassium 3.3 (L) 3.5 - 5.1 mmol/L   Chloride 89 (L) 101 - 111 mmol/L   CO2 27 22 - 32 mmol/L   Glucose, Bld 122 (H) 65 - 99 mg/dL   BUN 20 6 - 20 mg/dL   Creatinine, Ser 2.81 (H) 0.61 - 1.24 mg/dL   Calcium 8.3 (L) 8.9 - 10.3 mg/dL   Total Protein 5.7 (L) 6.5 - 8.1 g/dL   Albumin 2.7 (L) 3.5 - 5.0 g/dL   AST 164 (H) 15 - 41 U/L   ALT 90 (H) 17 - 63 U/L   Alkaline Phosphatase 126 38 - 126 U/L   Total Bilirubin 5.1 (H) 0.3 - 1.2 mg/dL   GFR calc non Af Amer 19 (L) >60 mL/min   GFR calc Af Amer 22 (L) >60 mL/min    Comment: (NOTE) The eGFR has been calculated using the CKD EPI equation. This calculation has not been validated in all clinical situations. eGFR's persistently <60 mL/min signify possible Chronic Kidney Disease.    Anion gap 12 5 - 15  Ammonia     Status: Abnormal   Collection  Time: 04/28/16  4:47 PM  Result Value Ref Range   Ammonia 48 (H) 9 - 35 umol/L  Protime-INR     Status: Abnormal   Collection Time: 04/28/16  4:47 PM  Result Value Ref Range   Prothrombin Time 30.9 (H) 11.4 - 15.2 seconds   INR 2.90   Magnesium     Status: Abnormal   Collection Time: 04/28/16  4:47 PM  Result Value Ref Range   Magnesium 1.3 (L) 1.7 - 2.4 mg/dL  I-Stat Chem 8, ED     Status: Abnormal   Collection Time: 04/28/16  4:48 PM  Result Value Ref Range   Sodium 125 (L) 135 - 145 mmol/L   Potassium 3.4 (L) 3.5 - 5.1 mmol/L   Chloride 84 (L) 101 - 111 mmol/L   BUN 27 (H) 6 - 20 mg/dL   Creatinine, Ser 3.00 (H) 0.61 - 1.24 mg/dL   Glucose, Bld 122 (H) 65 - 99 mg/dL   Calcium, Ion 0.94 (L) 1.15 - 1.40 mmol/L   TCO2 29 0 - 100 mmol/L   Hemoglobin 8.2 (L) 13.0 - 17.0 g/dL   HCT 24.0 (L) 39.0 - 52.0 %  POC occult blood, ED     Status: None   Collection Time: 04/28/16  5:15 PM  Result Value Ref Range   Fecal Occult   Bld NEGATIVE NEGATIVE  Urinalysis, Routine w reflex microscopic (not at [facility])     Status: Abnormal   Collection Time: 04/28/16  8:25 PM  Result Value Ref Range   Color, Urine AMBER (A) YELLOW    Comment: BIOCHEMICALS MAY BE AFFECTED BY COLOR   APPearance CLEAR CLEAR   Specific Gravity, Urine 1.011 1.005 - 1.030   pH 6.0 5.0 - 8.0   Glucose, UA NEGATIVE NEGATIVE mg/dL   Hgb urine dipstick NEGATIVE NEGATIVE   Bilirubin Urine SMALL (A) NEGATIVE   Ketones, ur NEGATIVE NEGATIVE mg/dL   Protein, ur NEGATIVE NEGATIVE mg/dL   Nitrite NEGATIVE NEGATIVE   Leukocytes, UA NEGATIVE NEGATIVE    Comment: MICROSCOPIC NOT DONE ON URINES WITH NEGATIVE PROTEIN, BLOOD, LEUKOCYTES, NITRITE, OR GLUCOSE <1000 mg/dL.  Creatinine, urine, random     Status: None   Collection Time: 04/28/16  8:25 PM  Result Value Ref Range   Creatinine, Urine 151.38 mg/dL  Sodium, urine, random     Status: None   Collection Time: 04/28/16  8:25 PM  Result Value Ref Range   Sodium, Ur 29 mmol/L   Phosphorus     Status: None   Collection Time: 04/28/16 11:55 PM  Result Value Ref Range   Phosphorus 3.4 2.5 - 4.6 mg/dL  TSH     Status: Abnormal   Collection Time: 04/29/16  3:40 AM  Result Value Ref Range   TSH 19.713 (H) 0.350 - 4.500 uIU/mL    Comment: Performed by a 3rd Generation assay with a functional sensitivity of <=0.01 uIU/mL.  Protime-INR     Status: Abnormal   Collection Time: 04/29/16  3:40 AM  Result Value Ref Range   Prothrombin Time 30.9 (H) 11.4 - 15.2 seconds   INR 2.90   Magnesium     Status: None   Collection Time: 04/29/16  3:40 AM  Result Value Ref Range   Magnesium 2.0 1.7 - 2.4 mg/dL  Phosphorus     Status: None   Collection Time: 04/29/16  3:40 AM  Result Value Ref Range   Phosphorus 3.2 2.5 - 4.6 mg/dL  Comprehensive metabolic panel     Status: Abnormal   Collection Time: 04/29/16  3:40 AM  Result Value Ref Range   Sodium 124 (L) 135 - 145 mmol/L   Potassium 3.2 (L) 3.5 - 5.1 mmol/L   Chloride 88 (L) 101 - 111 mmol/L   CO2 23 22 - 32 mmol/L   Glucose, Bld 100 (H) 65 - 99 mg/dL   BUN 23 (H) 6 - 20 mg/dL   Creatinine, Ser 2.72 (H) 0.61 - 1.24 mg/dL   Calcium 8.0 (L) 8.9 - 10.3 mg/dL   Total Protein 5.2 (L) 6.5 - 8.1 g/dL   Albumin 2.6 (L) 3.5 - 5.0 g/dL   AST 142 (H) 15 - 41 U/L   ALT 81 (H) 17 - 63 U/L   Alkaline Phosphatase 120 38 - 126 U/L   Total Bilirubin 4.5 (H) 0.3 - 1.2 mg/dL   GFR calc non Af Amer 20 (L) >60 mL/min   GFR calc Af Amer 23 (L) >60 mL/min    Comment: (NOTE) The eGFR has been calculated using the CKD EPI equation. This calculation has not been validated in all clinical situations. eGFR's persistently <60 mL/min signify possible Chronic Kidney Disease.    Anion gap 13 5 - 15  CBC     Status: Abnormal   Collection Time: 04/29/16  3:40 AM  Result Value Ref Range  WBC 6.8 4.0 - 10.5 K/uL   RBC 1.87 (L) 4.22 - 5.81 MIL/uL   Hemoglobin 6.4 (LL) 13.0 - 17.0 g/dL    Comment: REPEATED TO VERIFY CRITICAL RESULT CALLED  TO, READ BACK BY AND VERIFIED WITH: A JETER,RN [phone] WILDERK    HCT 17.9 (L) 39.0 - 52.0 %   MCV 95.7 78.0 - 100.0 fL   MCH 34.2 (H) 26.0 - 34.0 pg   MCHC 35.8 30.0 - 36.0 g/dL   RDW 15.4 11.5 - 15.5 %   Platelets 103 (L) 150 - 400 K/uL    Comment: REPEATED TO VERIFY CONSISTENT WITH PREVIOUS RESULT   Prepare RBC     Status: None   Collection Time: 04/29/16  3:40 AM  Result Value Ref Range   Order Confirmation ORDER PROCESSED BY BLOOD BANK   Ammonia     Status: Abnormal   Collection Time: 04/29/16  3:40 AM  Result Value Ref Range   Ammonia 59 (H) 9 - 35 umol/L  Vitamin B12     Status: Abnormal   Collection Time: 04/29/16  3:40 AM  Result Value Ref Range   Vitamin B-12 2,202 (H) 180 - 914 pg/mL    Comment: (NOTE) This assay is not validated for testing neonatal or myeloproliferative syndrome specimens for Vitamin B12 levels.   Folate     Status: None   Collection Time: 04/29/16  3:40 AM  Result Value Ref Range   Folate 14.3 >5.9 ng/mL  Iron and TIBC     Status: Abnormal   Collection Time: 04/29/16  3:40 AM  Result Value Ref Range   Iron 49 45 - 182 ug/dL   TIBC 113 (L) 250 - 450 ug/dL   Saturation Ratios 43 (H) 17.9 - 39.5 %   UIBC 64 ug/dL  Ferritin     Status: Abnormal   Collection Time: 04/29/16  3:40 AM  Result Value Ref Range   Ferritin 815 (H) 24 - 336 ng/mL  Reticulocytes     Status: Abnormal   Collection Time: 04/29/16  3:40 AM  Result Value Ref Range   Retic Ct Pct 3.7 (H) 0.4 - 3.1 %   RBC. 1.87 (L) 4.22 - 5.81 MIL/uL   Retic Count, Manual 69.2 19.0 - 186.0 K/uL  Lipid panel     Status: Abnormal   Collection Time: 04/29/16  3:40 AM  Result Value Ref Range   Cholesterol 75 0 - 200 mg/dL   Triglycerides 67 <150 mg/dL   HDL 18 (L) >40 mg/dL   Total CHOL/HDL Ratio 4.2 RATIO   VLDL 13 0 - 40 mg/dL   LDL Cholesterol 44 0 - 99 mg/dL    Comment:        Total Cholesterol/HDL:CHD Risk Coronary Heart Disease Risk Table                     Men   Women   1/2 Average Risk   3.4   3.3  Average Risk       5.0   4.4  2 X Average Risk   9.6   7.1  3 X Average Risk  23.4   11.0        Use the calculated Patient Ratio above and the CHD Risk Table to determine the patient's CHD Risk.        ATP III CLASSIFICATION (LDL):  <100     mg/dL   Optimal  100-129  mg/dL   Near or Above  Optimal  130-159  mg/dL   Borderline  160-189  mg/dL   High  >190     mg/dL   Very High   Prepare RBC     Status: None   Collection Time: 04/29/16  8:47 AM  Result Value Ref Range   Order Confirmation ORDER PROCESSED BY BLOOD BANK   Hemoglobin and hematocrit, blood     Status: Abnormal   Collection Time: 04/29/16 12:11 PM  Result Value Ref Range   Hemoglobin 7.8 (L) 13.0 - 17.0 g/dL   HCT 22.1 (L) 39.0 - 52.0 %  Prepare fresh frozen plasma     Status: None (Preliminary result)   Collection Time: 04/29/16  1:52 PM  Result Value Ref Range   Unit Number [id number]    Blood Component Type THWPLS APHR1    Unit division 00    Status of Unit ALLOCATED    Transfusion Status OK TO TRANSFUSE    Unit Number [id number]    Blood Component Type THWPLS APHR3    Unit division 00    Status of Unit ALLOCATED    Transfusion Status OK TO TRANSFUSE     Ct Abdomen Pelvis Wo Contrast  Result Date: 04/28/2016 CLINICAL DATA:  82 year old male with generalized weakness and fall. Jaundice. EXAM: CT ABDOMEN AND PELVIS WITHOUT CONTRAST TECHNIQUE: Multidetector CT imaging of the abdomen and pelvis was performed following the standard protocol without IV contrast. COMPARISON:  Abdominal radiograph dated 04/28/2016 FINDINGS: Evaluation of this exam is limited in the absence of intravenous contrast. Lower chest: Minimal hazy density in the lingula, likely atelectatic changes. The visualized lung bases are otherwise clear. There is moderate cardiomegaly with coronary vascular calcification. There is hypoattenuation of the cardiac blood pool suggestive of a degree of  anemia. Clinical correlation is recommended. No intra-abdominal free air or free fluid. Hepatobiliary: Diffuse fatty infiltration of the liver. A 1 cm ill-defined hypodensity in the left lobe of the liver as well as an ill-defined hypodensity in the right lobe of the liver (series 201 image 22) are indeterminate. Although this may be related to a benign etiology underlying neoplastic process or metastatic disease are not excluded. MRI without and with contrast may provide better evaluation. No intrahepatic biliary ductal dilatation. The gallbladder is predominantly contracted. No calcified gallstone identified. There is mild haziness of the pericholecystic fat. Ultrasound may provide better evaluation of the gallbladder. Pancreas: Unremarkable. No pancreatic ductal dilatation or surrounding inflammatory changes. Spleen: Normal in size without focal abnormality. Adrenals/Urinary Tract: The adrenal glands appear unremarkable. Mild bilateral renal atrophy. There is no hydronephrosis or nephrolithiasis on either side. A 2 cm left renal upper pole hypodense lesion is not well characterized but demonstrates fluid attenuation and most likely represents a cyst. Ultrasound or MRI may provide better characterisation. The visualized ureters appear unremarkable. There is mild thickening and trabecular appearance of the bladder wall, likely related chronic bladder outlet obstruction. Correlation with urinalysis recommended to exclude UTI. Stomach/Bowel: Oral contrast opacifies the stomach and multiple loops of small bowel traversing into the colon. There is thickened appearance of the gastric wall, likely related to underdistention. Gastritis is less likely. Mild thickening of the jejunal folds in the left hemi abdomen may be related to underdistention. Clinical correlation is recommended to evaluate for enteritis. No evidence of bowel obstruction. Appendectomy. Vascular/Lymphatic: Advanced aortoiliac atherosclerotic disease.  Evaluation of the aorta and IVC is limited on this noncontrast study. No portal venous gas identified. There is no adenopathy. Reproductive: The prostate gland is  enlarged and heterogeneous with median lobe hypertrophy indenting the base of the bladder. The prostate measures approximately 6.5 cm in transverse diameter. Other: None Musculoskeletal: There is osteopenia with degenerative changes of the spine. Multiple old left posterior rib fractures. No acute fracture. IMPRESSION: Thickened appearance of the stomach and jejunal folds may be related to underdistention or represent gastroenteritis. Clinical correlation is recommended. No bowel obstruction. Minimal pericholecystic haziness. Ultrasound may provide better evaluation of the gallbladder. **An incidental finding of potential clinical significance has been found. Ill-defined hepatic hypodense lesions are not characterized. Nonemergent MRI without and with contrast is recommended for further characterization.** Enlarged prostate gland with evidence of chronic bladder outlet obstruction. Correlation with urinalysis recommended to exclude superimposed UTI. Electronically Signed   By: Arash  Radparvar M.D.   On: 04/28/2016 23:52   Ct Head Wo Contrast  Result Date: 04/28/2016 CLINICAL DATA:  Generalized weakness.  Multiple recent falls. EXAM: CT HEAD WITHOUT CONTRAST TECHNIQUE: Contiguous axial images were obtained from the base of the skull through the vertex without intravenous contrast. COMPARISON:  04/21/2016 FINDINGS: Brain: Moderate cerebral atrophy is unchanged. Periventricular white matter hypodensities are unchanged and nonspecific but compatible with mild chronic small vessel ischemic disease. There is no evidence of acute cortical infarct, intracranial hemorrhage, mass, midline shift, or extra-axial fluid collection. Vascular: Calcified atherosclerosis at the skullbase. Skull: No fracture. Well-defined 2 cm sclerotic lesion in the right parietal  bone adjacent to the sagittal suture is unchanged. Sinuses/Orbits: Unchanged mild right sphenoid sinus mucosal thickening. Clear mastoid air cells. Prior bilateral cataract extraction. Other: None. IMPRESSION: 1. No evidence of acute intracranial abnormality. 2. Mild chronic small vessel ischemic disease and moderate cerebral atrophy. Electronically Signed   By: Allen  Grady M.D.   On: 04/28/2016 19:10   Dg Abd Portable 1 View  Result Date: 04/28/2016 CLINICAL DATA:  Generalized weakness. EXAM: PORTABLE ABDOMEN - 1 VIEW COMPARISON:  CT 12/11/2015. FINDINGS: Soft tissue structures are unremarkable. No bowel distention. Nondistended air-filled loops of small and large bowel are noted. Mild thickening of small-bowel loops noted left upper quadrant. Process such enteritis cannot be excluded. Aortoiliac atherosclerotic vascular calcification. IMPRESSION: 1. Mild thickening of left upper quadrant proximal small bowel loops cannot be excluded. A process such as enteritis cannot be completely excluded. Exam otherwise unremarkable. No evidence of free air. 2. Aortoiliac atherosclerotic vascular disease . Electronically Signed   By: Thomas  Register   On: 04/28/2016 17:14   Us Abdomen Limited Ruq  Result Date: 04/29/2016 CLINICAL DATA:  Elevated LFTs EXAM: US ABDOMEN LIMITED - RIGHT UPPER QUADRANT COMPARISON:  04/28/2016 FINDINGS: Gallbladder: No gallstones are noted within gallbladder. No thickening of gallbladder wall. No sonographic Murphy's sign. Common bile duct: Diameter: 6.2 mm in diameter Liver: There is diffuse increased echogenicity of the liver suspicious for fatty infiltration. Complex cyst in right hepatic lobe measures 1.9 x 1.8 cm. Further correlation with MRI is recommended. No intrahepatic biliary ductal dilatation. IMPRESSION: 1. No gallstones are noted within gallbladder. Diffuse increased echogenicity of the liver suspicious for fatty infiltration. Normal CBD. Complex cyst in right hepatic lobe  measures 1.9 cm. Further correlation with MRI is recommended. Electronically Signed   By: Liviu  Pop M.D.   On: 04/29/2016 10:10               Blood pressure 117/71, pulse 77, temperature 97.9 F (36.6 C), temperature source Oral, resp. rate 18, height 5' 8" (1.727 m), weight 70.7 kg (155 lb 14.4 oz), SpO2 94 %.  Physical   exam:   General-- pleasant white male with bleeding lesion on his right elbow wrapped in bandage ENT-- slight scleral icterus Neck-- no gross masses Heart-- no gross murmurs or gallops Lungs-- clear with no wheezes Abdomen-- soft and nontender with no clear ascites. Liver borders palpable Psych-- slightly confused but pleasant   Assessment: 1. Anemia. The etiology of this is unclear and may be multifactorial. He does not seem to be actively bleeding at this time. I think it would be reasonable to go ahead with EGD particularly given the thickened gastric folds seen on CT scan. 2. Alcoholic liver disease with fatty liver demonstrated on imaging. No gross cirrhosis on image. 3. Atrial fibrillation anticoagulated. Eliquis on hold due to his profound anemia 4. Alcoholism. He is exhibiting signs of alcohol withdrawal.  Plan: 1. You would hold the Eliquis since he is anticoagulated due to his liver disease. 2. Will proceed with EGD in the morning to evaluate the enlarged gastric folds in the stomach look for varices or other signs of a bleeding source. 3. Would continue him on them. PPI therapy 4. Alcohol withdrawal protocol.   Rhyland Hinderliter JR,Jediah Horger L 04/29/2016, 2:48 PM   This note was created using voice recognition software and minor errors may Have occurred unintentionally. Pager: [phone] If no answer or after hours call [phone]

## 2016-04-29 NOTE — Care Management Note (Signed)
Case Management Note  Patient Details  Name: Xavier Taylor MRN: [id number] Date of Birth: 09-Sep-1929  Subjective/Objective:                 [facility], [facility], shower seat. Grandson lives with her. Has housekeeper and 3 meals a day. ETOH, anemic, r/o varices, getting blood. Waiting on PT eval.   Action/Plan:  [facility], may need HH, waiting on PT eval.   Expected Discharge Date:                  Expected Discharge Plan:  Home/Self Care  In-House Referral:  NA  Discharge planning Services  CM Consult  Post Acute Care Choice:    Choice offered to:     DME Arranged:    DME Agency:     HH Arranged:    HH Agency:     Status of Service:  In process, will continue to follow  If discussed at Long Length of Stay Meetings, dates discussed:    Additional Comments:  Lawerance SabalDebbie Masashi Snowdon, RN 04/29/2016, 10:14 AM

## 2016-04-30 ENCOUNTER — Encounter (HOSPITAL_COMMUNITY)
Admission: EM | Disposition: A | Discharge status: Hospice | Payer: Self-pay | Source: Home / Self Care | Attending: Internal Medicine

## 2016-04-30 ENCOUNTER — Encounter (HOSPITAL_COMMUNITY): Payer: Self-pay | Admitting: *Deleted

## 2016-04-30 DIAGNOSIS — N183 Chronic kidney disease, stage 3 (moderate): Secondary | ICD-10-CM

## 2016-04-30 DIAGNOSIS — N179 Acute kidney failure, unspecified: Secondary | ICD-10-CM

## 2016-04-30 DIAGNOSIS — D649 Anemia, unspecified: Secondary | ICD-10-CM

## 2016-04-30 DIAGNOSIS — I2510 Atherosclerotic heart disease of native coronary artery without angina pectoris: Secondary | ICD-10-CM

## 2016-04-30 DIAGNOSIS — K7200 Acute and subacute hepatic failure without coma: Secondary | ICD-10-CM

## 2016-04-30 DIAGNOSIS — D696 Thrombocytopenia, unspecified: Secondary | ICD-10-CM

## 2016-04-30 DIAGNOSIS — E039 Hypothyroidism, unspecified: Secondary | ICD-10-CM

## 2016-04-30 DIAGNOSIS — J410 Simple chronic bronchitis: Secondary | ICD-10-CM

## 2016-04-30 HISTORY — PX: ESOPHAGOGASTRODUODENOSCOPY: SHX5428

## 2016-04-30 LAB — CBC WITH DIFFERENTIAL/PLATELET
BASOS PCT: 0 %
Basophils Absolute: 0 10*3/uL (ref 0.0–0.1)
Eosinophils Absolute: 0 10*3/uL (ref 0.0–0.7)
Eosinophils Relative: 1 %
HEMATOCRIT: 20.4 % — AB (ref 39.0–52.0)
Hemoglobin: 7.2 g/dL — ABNORMAL LOW (ref 13.0–17.0)
LYMPHS PCT: 14 %
Lymphs Abs: 0.9 10*3/uL (ref 0.7–4.0)
MCH: 33 pg (ref 26.0–34.0)
MCHC: 35.3 g/dL (ref 30.0–36.0)
MCV: 93.6 fL (ref 78.0–100.0)
MONO ABS: 0.5 10*3/uL (ref 0.1–1.0)
MONOS PCT: 8 %
NEUTROS ABS: 5 10*3/uL (ref 1.7–7.7)
Neutrophils Relative %: 78 %
Platelets: 112 10*3/uL — ABNORMAL LOW (ref 150–400)
RBC: 2.18 MIL/uL — ABNORMAL LOW (ref 4.22–5.81)
RDW: 18.2 % — AB (ref 11.5–15.5)
WBC: 6.4 10*3/uL (ref 4.0–10.5)

## 2016-04-30 LAB — BASIC METABOLIC PANEL
Anion gap: 11 (ref 5–15)
BUN: 22 mg/dL — AB (ref 6–20)
CHLORIDE: 96 mmol/L — AB (ref 101–111)
CO2: 25 mmol/L (ref 22–32)
CREATININE: 2.22 mg/dL — AB (ref 0.61–1.24)
Calcium: 8.6 mg/dL — ABNORMAL LOW (ref 8.9–10.3)
GFR calc Af Amer: 30 mL/min — ABNORMAL LOW (ref >60)
GFR calc non Af Amer: 26 mL/min — ABNORMAL LOW (ref >60)
Glucose, Bld: 98 mg/dL (ref 65–99)
Potassium: 4 mmol/L (ref 3.5–5.1)
SODIUM: 132 mmol/L — AB (ref 135–145)

## 2016-04-30 LAB — PREPARE FRESH FROZEN PLASMA
UNIT DIVISION: 0
Unit division: 0

## 2016-04-30 LAB — HEPATIC FUNCTION PANEL
ALT: 83 U/L — ABNORMAL HIGH (ref 17–63)
AST: 151 U/L — ABNORMAL HIGH (ref 15–41)
Albumin: 2.7 g/dL — ABNORMAL LOW (ref 3.5–5.0)
Alkaline Phosphatase: 109 U/L (ref 38–126)
BILIRUBIN DIRECT: 2.3 mg/dL — AB (ref 0.1–0.5)
BILIRUBIN INDIRECT: 1.9 mg/dL — AB (ref 0.3–0.9)
BILIRUBIN TOTAL: 4.2 mg/dL — AB (ref 0.3–1.2)
Total Protein: 5.6 g/dL — ABNORMAL LOW (ref 6.5–8.1)

## 2016-04-30 LAB — HEPATITIS PANEL, ACUTE
HEP A IGM: NEGATIVE
Hep B C IgM: NEGATIVE
Hepatitis B Surface Ag: NEGATIVE

## 2016-04-30 LAB — PROTIME-INR
INR: 1.72
PROTHROMBIN TIME: 20.3 s — AB (ref 11.4–15.2)

## 2016-04-30 SURGERY — EGD (ESOPHAGOGASTRODUODENOSCOPY)
Anesthesia: Moderate Sedation

## 2016-04-30 MED ORDER — SODIUM CHLORIDE 0.9 % IV SOLN: INTRAVENOUS | Status: DC

## 2016-04-30 MED ORDER — MIDAZOLAM HCL 10 MG/2ML IJ SOLN
INTRAMUSCULAR | Status: DC | PRN
  Administered 2016-04-30: 2 mg via INTRAVENOUS
  Administered 2016-04-30: 1 mg via INTRAVENOUS

## 2016-04-30 MED ORDER — FENTANYL CITRATE (PF) 100 MCG/2ML IJ SOLN
INTRAMUSCULAR | Status: AC
  Filled 2016-04-30: qty 2

## 2016-04-30 MED ORDER — MIDAZOLAM HCL 5 MG/ML IJ SOLN
INTRAMUSCULAR | Status: AC
  Filled 2016-04-30: qty 2

## 2016-04-30 MED ORDER — BUTAMBEN-TETRACAINE-BENZOCAINE 2-2-14 % EX AERO
INHALATION_SPRAY | CUTANEOUS | Status: DC | PRN
  Administered 2016-04-30: 2 via TOPICAL

## 2016-04-30 MED ORDER — FENTANYL CITRATE (PF) 100 MCG/2ML IJ SOLN
INTRAMUSCULAR | Status: DC | PRN
  Administered 2016-04-30: 25 ug via INTRAVENOUS

## 2016-04-30 NOTE — Progress Notes (Addendum)
CSW emailed list of current SNF offers to [name] who is helping the pt make his decisions regarding rehab.  [name]  CSW will continue to follow  Burna SisJenna H. Azariya Freeman, LCSW Clinical Social Worker [phone]

## 2016-04-30 NOTE — Op Note (Signed)
[facility] Patient Name: Xavier GoresJackie Dubeau Procedure Date : 04/30/2016 MRN: [id number] Attending MD: Tresea MallJames L Martie Fulgham Dr., MD Date of Birth: 05-22-1930 CSN: [id number] Age: 82 Admit Type: Inpatient Procedure:                Upper GI endoscopy Indications:              Unexplained iron deficiency anemia, Abnormal CT of                            the GI tract Providers:                Fayrene FearingJames L. Jaamal Farooqui Dr., MD, Roselie AwkwardShannon Love, RN, Arlee Muslimhris                            Chandler Tech., Technician Referring MD:             [facility]. PCP: [facility] Medicines:                Fentanyl 25 micrograms IV, Midazolam 3 mg IV,                            Cetacaine spray Complications:            No immediate complications. Estimated Blood Loss:     Estimated blood loss: none. Procedure:                Pre-Anesthesia Assessment:                           - Prior to the procedure, a History and Physical                            was performed, and patient medications and                            allergies were reviewed. The patient's tolerance of                            previous anesthesia was also reviewed. The risks                            and benefits of the procedure and the sedation                            options and risks were discussed with the patient.                            All questions were answered, and informed consent                            was obtained. Prior Anticoagulants: The patient has                            taken no previous anticoagulant or antiplatelet  agents. ASA Grade Assessment: III - A patient with                            severe systemic disease. After reviewing the risks                            and benefits, the patient was deemed in                            satisfactory condition to undergo the procedure.                           After obtaining informed consent, the endoscope was                             passed under direct vision. Throughout the                            procedure, the patient's blood pressure, pulse, and                            oxygen saturations were monitored continuously. The                            EG-[id number] ([id number]) scope was introduced through the                            mouth, and advanced to the second part of duodenum.                            The upper GI endoscopy was accomplished without                            difficulty. The patient tolerated the procedure                            well. Scope In: Scope Out: Findings:      There is no endoscopic evidence of Barrett's esophagus, esophagitis,       inflammation, stricture, ulcerations or varices in the entire esophagus.      The stomach was normal.      The examined duodenum was normal. Impression:               - Normal stomach.                           - Normal examined duodenum.                           - No specimens collected.                           - Anemia-no etiology evident on endoscopic                            evaluation  Moderate Sedation:      Moderate (conscious) sedation was administered by the endoscopy nurse       and supervised by the endoscopist. The following parameters were       monitored: oxygen saturation, heart rate, blood pressure, and response       to care. Recommendation:           - Return patient to hospital ward for ongoing care.                           - Perform a colonoscopy in 2 months. After the pt                            has gone through EtOH withdrawal.                           - Advance diet as tolerated.                           - Continue present medications. Procedure Code(s):        --- Professional ---                           [phone], Esophagogastroduodenoscopy, flexible,                            transoral; diagnostic, including collection of                            specimen(s) by brushing or washing, when performed                             (separate procedure) Diagnosis Code(s):        --- Professional ---                           D50.9, Iron deficiency anemia, unspecified                           R93.3, Abnormal findings on diagnostic imaging of                            other parts of digestive tract CPT copyright 2016 [employer]. All rights reserved. The codes documented in this report are preliminary and upon coder review may  be revised to meet current compliance requirements. Tresea Mall Dr., MD 04/30/2016 10:46:23 AM This report has been signed electronically. Number of Addenda: 0

## 2016-04-30 NOTE — Progress Notes (Signed)
Physical Therapy Treatment Patient Details Name: Xavier Taylor MRN: [id number] DOB: [birth year] Today's Date: 04/30/2016    History of Present Illness pt is an 82 y/o male with mh of CAD, s/CABG, Alcohol abuse, COPD, admitted to ED with confusion, fatigue and weakness over the last 1.5 weeks, with difficulty walking.    PT Comments    Pt found with urinary and stool incontinence.  Pt educated on need to mobilize and move from bed to chair to allow NT to change bed.  Pt agreeable to OOB but refused perianal care.    Follow Up Recommendations  SNF     Equipment Recommendations  None recommended by PT    Recommendations for Other Services       Precautions / Restrictions Precautions Precautions: Fall Restrictions Weight Bearing Restrictions: No    Mobility  Bed Mobility Overal bed mobility: Needs Assistance Bed Mobility: Supine to Sit     Supine to sit: Min assist     General bed mobility comments: Pt required assist for trunk and LE advancement to edge of bed.    Transfers Overall transfer level: Needs assistance Equipment used: Rolling walker (2 wheeled) Transfers: Sit to/from Stand Sit to Stand: Min guard         General transfer comment: cues for hand placement, instability remains when transitioning hands from bed to RW.    Ambulation/Gait Ambulation/Gait assistance: Min assist Ambulation Distance (Feet):  (steps to chair refused ambulation.  ) Assistive device: Rolling walker (2 wheeled) Gait Pattern/deviations: Step-through pattern;Decreased stride length;Trunk flexed     General Gait Details: Pt odd balance and required assist with RW to turn and sit in recliner.     Stairs            Wheelchair Mobility    Modified Rankin (Stroke Patients Only)       Balance     Sitting balance-Leahy Scale: Fair       Standing balance-Leahy Scale: Poor                      Cognition Arousal/Alertness: Awake/alert Behavior During  Therapy: WFL for tasks assessed/performed Overall Cognitive Status: Impaired/Different from baseline Area of Impairment: Attention;Safety/judgement;Awareness               General Comments: Pt lying in stool and urine and refused to have his bottom cleaned.      Exercises      General Comments        Pertinent Vitals/Pain Pain Assessment: No/denies pain (reports he moans at baseline.  )    Home Living                      Prior Function            PT Goals (current goals can now be found in the care plan section) Acute Rehab PT Goals Patient Stated Goal: pt did not state, family needs for him to get back some strength and go to ALF post SNF stay PT Goal Formulation: With patient Potential to Achieve Goals: Fair Progress towards PT goals: Progressing toward goals    Frequency    Min 3X/week      PT Plan Current plan remains appropriate    Co-evaluation             End of Session Equipment Utilized During Treatment: Gait belt Activity Tolerance: Patient tolerated treatment well;Patient limited by fatigue Patient left: in chair;with call bell/phone within reach;with chair  alarm set;with family/visitor present     Time: [id number] PT Time Calculation (min) (ACUTE ONLY): 35 min  Charges:  $Therapeutic Activity: 23-37 mins                    G Codes:      Florestine Aversimee J Natale Thoma 04/30/2016, 4:47 PM  Joycelyn RuaAimee Trissa Molina, PTA pager [phone]

## 2016-04-30 NOTE — H&P (View-Only) (Signed)
[place] Reason for consult: profound anemia and abnormal liver test Referring Physician: [facility]. PCP: Dr. Lajean Manes. Primary G.I.:   Xavier Taylor is an 82 y.o. male.  HPI: he has a long history of drinking for over 50 years and according to the daughter has been drinking quite heavily for the past 6 to 8 months bottle of Southern comfort a day or possibly more. He has been getting progressively confused and when he has stopped drinking has become quite agitated and shaky. He was feeling considerably weak and was brought to the emergency room where he was found to have abnormal liver test with slight elevation of AST and ALT with albumin level 2.6 and total bilirubin of 4.5. INR was markedly elevated it to .9 with PT 30.9. The patient is not on Coumadin but in fact has been on Eliquis for atrial fibrillation. Stool was negative here in the hospital but hemoglobin was 6.4. Plalet count was 103. CT scan showed marked fatty liver and marked thickening of the stomach that was felt to be due to under distention. Ultrasound showed marked fatty liver but no gallstones. The daughter notes that the patient and his wife have moved up. Recently from [place] because of declining mental status and drinking issue. We did receive records from gastroenterologist in [place] who has followed him for GERD. He underwent EGD 2012 with gastritis and esophagitis but no varices noted in colonoscopy 2013 it was basically negative. He had ultrasounds done in [place] last 2013 with fatty liver noted with small amount of ascites. The patient's family is quite concerned about his drinking. The patient indicates to me that he would like to stop drinking. It appears from his medication list that he is currently taking omeprazole as well as Eliquis. He has not had EGD or colonoscopy since those done in [place] in 2013.  Past Medical History:  Diagnosis Date  . A-fib ([place])   . Ascites   . BPH  (benign prostatic hypertrophy)   . CAD (coronary artery disease)   . Chronic renal disease, stage III   . COPD (chronic obstructive pulmonary disease) ([place])   . Coronary artery disease   . Decreased GFR   . GERD with stricture   . Hypertension   . Hypothyroid   . Internal hemorrhoids   . Renal disorder   . S/P CABG x 5     Past Surgical History:  Procedure Laterality Date  . APPENDECTOMY    . BACK SURGERY    . CARDIAC SURGERY      Family History  Problem Relation Age of Onset  . Heart attack Father 62  . Diabetes Son   . Diabetes Son   . Heart attack Son 50  . Breast cancer Daughter   . Rheum arthritis Mother     Social History:  reports that he quit smoking about 22 years ago. His smoking use included Cigarettes. He has a 80.00 pack-year smoking history. He has quit using smokeless tobacco. He reports that he drinks about 16.8 oz of alcohol per week . His drug history is not on file.  Allergies:  Allergies  Allergen Reactions  . Exelon [Rivastigmine] Other (See Comments)    Didn't work for patient    Medications; Prior to Admission medications   Medication Sig Start Date End Date Taking? Authorizing Provider  carvedilol (COREG) 12.5 MG tablet Take 12.5 mg by mouth 2 (two) times daily with a meal.   Yes Historical Provider, MD  ELIQUIS 2.5 MG TABS tablet Take 2.5 mg by mouth daily.  02/21/16  Yes Historical Provider, MD  fluticasone (FLONASE) 50 MCG/ACT nasal spray Place 2 sprays into the nose daily.    Yes Historical Provider, MD  furosemide (LASIX) 40 MG tablet Take 40 mg by mouth daily.   Yes Historical Provider, MD  levothyroxine (SYNTHROID, LEVOTHROID) 50 MCG tablet Take 50 mcg by mouth daily.   Yes Historical Provider, MD  Multiple Vitamin (MULTIVITAMIN WITH MINERALS) TABS tablet Take 1 tablet by mouth daily.   Yes Historical Provider, MD  omeprazole (PRILOSEC) 20 MG capsule Take 20 mg by mouth daily.    Yes Historical Provider, MD  pramipexole (MIRAPEX) 0.25 MG  tablet Take 0.25 mg by mouth at bedtime.   Yes Historical Provider, MD  simvastatin (ZOCOR) 10 MG tablet Take 10 mg by mouth daily at 6 PM.    Yes Historical Provider, MD  Tamsulosin HCl (FLOMAX) 0.4 MG CAPS Take 0.4 mg by mouth daily.   Yes Historical Provider, MD  chlorproMAZINE (THORAZINE) 25 MG tablet Take 25 mg by mouth 3 (three) times daily as needed for hiccoughs.     Historical Provider, MD  potassium chloride SA (K-DUR,KLOR-CON) 20 MEQ tablet Take 1 tablet (20 mEq total) by mouth 2 (two) times daily. Patient not taking: Reported on 04/28/2016 03/13/16   Charlann Lange, PA-C   . sodium chloride   Intravenous Once  . carvedilol  12.5 mg Oral BID WC  . feeding supplement (ENSURE ENLIVE)  237 mL Oral BID BM  . folic acid  1 mg Oral Daily  . levothyroxine  50 mcg Oral QAC breakfast  . mouth rinse  15 mL Mouth Rinse BID  . multivitamin with minerals  1 tablet Oral Daily  . pramipexole  0.25 mg Oral QHS  . tamsulosin  0.4 mg Oral Daily  . thiamine  100 mg Oral Daily   Or  . thiamine  100 mg Intravenous Daily   PRN Meds albuterol, LORazepam **OR** LORazepam, ondansetron **OR** ondansetron (ZOFRAN) IV Results for orders placed or performed during the hospital encounter of 04/28/16 (from the past 48 hour(s))  Osmolality, urine     Status: Abnormal   Collection Time: 04/28/16 10:25 AM  Result Value Ref Range   Osmolality, Ur 270 (L) 300 - 900 mOsm/kg  Type and screen     Status: None (Preliminary result)   Collection Time: 04/28/16  4:30 PM  Result Value Ref Range   ABO/RH(D) A POS    Antibody Screen NEG    Sample Expiration 05/01/2016    Unit Number [id number]    Blood Component Type RED CELLS,LR    Unit division 00    Status of Unit ISSUED    Transfusion Status OK TO TRANSFUSE    Crossmatch Result Compatible    Unit Number [id number]    Blood Component Type RED CELLS,LR    Unit division 00    Status of Unit ALLOCATED    Transfusion Status OK TO TRANSFUSE    Crossmatch  Result Compatible   ABO/Rh     Status: None   Collection Time: 04/28/16  4:30 PM  Result Value Ref Range   ABO/RH(D) A POS   CBC with Differential     Status: Abnormal   Collection Time: 04/28/16  4:47 PM  Result Value Ref Range   WBC 7.5 4.0 - 10.5 K/uL   RBC 2.05 (L) 4.22 - 5.81 MIL/uL   Hemoglobin 7.1 (L) 13.0 - 17.0 g/dL  HCT 19.8 (L) 39.0 - 52.0 %   MCV 96.6 78.0 - 100.0 fL   MCH 34.6 (H) 26.0 - 34.0 pg   MCHC 35.9 30.0 - 36.0 g/dL   RDW 15.3 11.5 - 15.5 %   Platelets 115 (L) 150 - 400 K/uL    Comment: REPEATED TO VERIFY SPECIMEN CHECKED FOR CLOTS PLATELETS APPEAR DECREASED CONSISTENT WITH PREVIOUS RESULT    Neutrophils Relative % 77 %   Neutro Abs 5.8 1.7 - 7.7 K/uL   Lymphocytes Relative 16 %   Lymphs Abs 1.2 0.7 - 4.0 K/uL   Monocytes Relative 6 %   Monocytes Absolute 0.5 0.1 - 1.0 K/uL   Eosinophils Relative 0 %   Eosinophils Absolute 0.0 0.0 - 0.7 K/uL   Basophils Relative 0 %   Basophils Absolute 0.0 0.0 - 0.1 K/uL  Comprehensive metabolic panel     Status: Abnormal   Collection Time: 04/28/16  4:47 PM  Result Value Ref Range   Sodium 128 (L) 135 - 145 mmol/L   Potassium 3.3 (L) 3.5 - 5.1 mmol/L   Chloride 89 (L) 101 - 111 mmol/L   CO2 27 22 - 32 mmol/L   Glucose, Bld 122 (H) 65 - 99 mg/dL   BUN 20 6 - 20 mg/dL   Creatinine, Ser 2.81 (H) 0.61 - 1.24 mg/dL   Calcium 8.3 (L) 8.9 - 10.3 mg/dL   Total Protein 5.7 (L) 6.5 - 8.1 g/dL   Albumin 2.7 (L) 3.5 - 5.0 g/dL   AST 164 (H) 15 - 41 U/L   ALT 90 (H) 17 - 63 U/L   Alkaline Phosphatase 126 38 - 126 U/L   Total Bilirubin 5.1 (H) 0.3 - 1.2 mg/dL   GFR calc non Af Amer 19 (L) >60 mL/min   GFR calc Af Amer 22 (L) >60 mL/min    Comment: (NOTE) The eGFR has been calculated using the CKD EPI equation. This calculation has not been validated in all clinical situations. eGFR's persistently <60 mL/min signify possible Chronic Kidney Disease.    Anion gap 12 5 - 15  Ammonia     Status: Abnormal   Collection  Time: 04/28/16  4:47 PM  Result Value Ref Range   Ammonia 48 (H) 9 - 35 umol/L  Protime-INR     Status: Abnormal   Collection Time: 04/28/16  4:47 PM  Result Value Ref Range   Prothrombin Time 30.9 (H) 11.4 - 15.2 seconds   INR 2.90   Magnesium     Status: Abnormal   Collection Time: 04/28/16  4:47 PM  Result Value Ref Range   Magnesium 1.3 (L) 1.7 - 2.4 mg/dL  I-Stat Chem 8, ED     Status: Abnormal   Collection Time: 04/28/16  4:48 PM  Result Value Ref Range   Sodium 125 (L) 135 - 145 mmol/L   Potassium 3.4 (L) 3.5 - 5.1 mmol/L   Chloride 84 (L) 101 - 111 mmol/L   BUN 27 (H) 6 - 20 mg/dL   Creatinine, Ser 3.00 (H) 0.61 - 1.24 mg/dL   Glucose, Bld 122 (H) 65 - 99 mg/dL   Calcium, Ion 0.94 (L) 1.15 - 1.40 mmol/L   TCO2 29 0 - 100 mmol/L   Hemoglobin 8.2 (L) 13.0 - 17.0 g/dL   HCT 24.0 (L) 39.0 - 52.0 %  POC occult blood, ED     Status: None   Collection Time: 04/28/16  5:15 PM  Result Value Ref Range   Fecal Occult  Bld NEGATIVE NEGATIVE  Urinalysis, Routine w reflex microscopic (not at [facility])     Status: Abnormal   Collection Time: 04/28/16  8:25 PM  Result Value Ref Range   Color, Urine AMBER (A) YELLOW    Comment: BIOCHEMICALS MAY BE AFFECTED BY COLOR   APPearance CLEAR CLEAR   Specific Gravity, Urine 1.011 1.005 - 1.030   pH 6.0 5.0 - 8.0   Glucose, UA NEGATIVE NEGATIVE mg/dL   Hgb urine dipstick NEGATIVE NEGATIVE   Bilirubin Urine SMALL (A) NEGATIVE   Ketones, ur NEGATIVE NEGATIVE mg/dL   Protein, ur NEGATIVE NEGATIVE mg/dL   Nitrite NEGATIVE NEGATIVE   Leukocytes, UA NEGATIVE NEGATIVE    Comment: MICROSCOPIC NOT DONE ON URINES WITH NEGATIVE PROTEIN, BLOOD, LEUKOCYTES, NITRITE, OR GLUCOSE <1000 mg/dL.  Creatinine, urine, random     Status: None   Collection Time: 04/28/16  8:25 PM  Result Value Ref Range   Creatinine, Urine 151.38 mg/dL  Sodium, urine, random     Status: None   Collection Time: 04/28/16  8:25 PM  Result Value Ref Range   Sodium, Ur 29 mmol/L   Phosphorus     Status: None   Collection Time: 04/28/16 11:55 PM  Result Value Ref Range   Phosphorus 3.4 2.5 - 4.6 mg/dL  TSH     Status: Abnormal   Collection Time: 04/29/16  3:40 AM  Result Value Ref Range   TSH 19.713 (H) 0.350 - 4.500 uIU/mL    Comment: Performed by a 3rd Generation assay with a functional sensitivity of <=0.01 uIU/mL.  Protime-INR     Status: Abnormal   Collection Time: 04/29/16  3:40 AM  Result Value Ref Range   Prothrombin Time 30.9 (H) 11.4 - 15.2 seconds   INR 2.90   Magnesium     Status: None   Collection Time: 04/29/16  3:40 AM  Result Value Ref Range   Magnesium 2.0 1.7 - 2.4 mg/dL  Phosphorus     Status: None   Collection Time: 04/29/16  3:40 AM  Result Value Ref Range   Phosphorus 3.2 2.5 - 4.6 mg/dL  Comprehensive metabolic panel     Status: Abnormal   Collection Time: 04/29/16  3:40 AM  Result Value Ref Range   Sodium 124 (L) 135 - 145 mmol/L   Potassium 3.2 (L) 3.5 - 5.1 mmol/L   Chloride 88 (L) 101 - 111 mmol/L   CO2 23 22 - 32 mmol/L   Glucose, Bld 100 (H) 65 - 99 mg/dL   BUN 23 (H) 6 - 20 mg/dL   Creatinine, Ser 2.72 (H) 0.61 - 1.24 mg/dL   Calcium 8.0 (L) 8.9 - 10.3 mg/dL   Total Protein 5.2 (L) 6.5 - 8.1 g/dL   Albumin 2.6 (L) 3.5 - 5.0 g/dL   AST 142 (H) 15 - 41 U/L   ALT 81 (H) 17 - 63 U/L   Alkaline Phosphatase 120 38 - 126 U/L   Total Bilirubin 4.5 (H) 0.3 - 1.2 mg/dL   GFR calc non Af Amer 20 (L) >60 mL/min   GFR calc Af Amer 23 (L) >60 mL/min    Comment: (NOTE) The eGFR has been calculated using the CKD EPI equation. This calculation has not been validated in all clinical situations. eGFR's persistently <60 mL/min signify possible Chronic Kidney Disease.    Anion gap 13 5 - 15  CBC     Status: Abnormal   Collection Time: 04/29/16  3:40 AM  Result Value Ref Range  WBC 6.8 4.0 - 10.5 K/uL   RBC 1.87 (L) 4.22 - 5.81 MIL/uL   Hemoglobin 6.4 (LL) 13.0 - 17.0 g/dL    Comment: REPEATED TO VERIFY CRITICAL RESULT CALLED  TO, READ BACK BY AND VERIFIED WITH: A JETER,RN [phone] WILDERK    HCT 17.9 (L) 39.0 - 52.0 %   MCV 95.7 78.0 - 100.0 fL   MCH 34.2 (H) 26.0 - 34.0 pg   MCHC 35.8 30.0 - 36.0 g/dL   RDW 15.4 11.5 - 15.5 %   Platelets 103 (L) 150 - 400 K/uL    Comment: REPEATED TO VERIFY CONSISTENT WITH PREVIOUS RESULT   Prepare RBC     Status: None   Collection Time: 04/29/16  3:40 AM  Result Value Ref Range   Order Confirmation ORDER PROCESSED BY BLOOD BANK   Ammonia     Status: Abnormal   Collection Time: 04/29/16  3:40 AM  Result Value Ref Range   Ammonia 59 (H) 9 - 35 umol/L  Vitamin B12     Status: Abnormal   Collection Time: 04/29/16  3:40 AM  Result Value Ref Range   Vitamin B-12 2,202 (H) 180 - 914 pg/mL    Comment: (NOTE) This assay is not validated for testing neonatal or myeloproliferative syndrome specimens for Vitamin B12 levels.   Folate     Status: None   Collection Time: 04/29/16  3:40 AM  Result Value Ref Range   Folate 14.3 >5.9 ng/mL  Iron and TIBC     Status: Abnormal   Collection Time: 04/29/16  3:40 AM  Result Value Ref Range   Iron 49 45 - 182 ug/dL   TIBC 113 (L) 250 - 450 ug/dL   Saturation Ratios 43 (H) 17.9 - 39.5 %   UIBC 64 ug/dL  Ferritin     Status: Abnormal   Collection Time: 04/29/16  3:40 AM  Result Value Ref Range   Ferritin 815 (H) 24 - 336 ng/mL  Reticulocytes     Status: Abnormal   Collection Time: 04/29/16  3:40 AM  Result Value Ref Range   Retic Ct Pct 3.7 (H) 0.4 - 3.1 %   RBC. 1.87 (L) 4.22 - 5.81 MIL/uL   Retic Count, Manual 69.2 19.0 - 186.0 K/uL  Lipid panel     Status: Abnormal   Collection Time: 04/29/16  3:40 AM  Result Value Ref Range   Cholesterol 75 0 - 200 mg/dL   Triglycerides 67 <150 mg/dL   HDL 18 (L) >40 mg/dL   Total CHOL/HDL Ratio 4.2 RATIO   VLDL 13 0 - 40 mg/dL   LDL Cholesterol 44 0 - 99 mg/dL    Comment:        Total Cholesterol/HDL:CHD Risk Coronary Heart Disease Risk Table                     Men   Women   1/2 Average Risk   3.4   3.3  Average Risk       5.0   4.4  2 X Average Risk   9.6   7.1  3 X Average Risk  23.4   11.0        Use the calculated Patient Ratio above and the CHD Risk Table to determine the patient's CHD Risk.        ATP III CLASSIFICATION (LDL):  <100     mg/dL   Optimal  100-129  mg/dL   Near or Above  Optimal  130-159  mg/dL   Borderline  160-189  mg/dL   High  >190     mg/dL   Very High   Prepare RBC     Status: None   Collection Time: 04/29/16  8:47 AM  Result Value Ref Range   Order Confirmation ORDER PROCESSED BY BLOOD BANK   Hemoglobin and hematocrit, blood     Status: Abnormal   Collection Time: 04/29/16 12:11 PM  Result Value Ref Range   Hemoglobin 7.8 (L) 13.0 - 17.0 g/dL   HCT 22.1 (L) 39.0 - 52.0 %  Prepare fresh frozen plasma     Status: None (Preliminary result)   Collection Time: 04/29/16  1:52 PM  Result Value Ref Range   Unit Number [id number]    Blood Component Type THWPLS APHR1    Unit division 00    Status of Unit ALLOCATED    Transfusion Status OK TO TRANSFUSE    Unit Number [id number]    Blood Component Type THWPLS APHR3    Unit division 00    Status of Unit ALLOCATED    Transfusion Status OK TO TRANSFUSE     Ct Abdomen Pelvis Wo Contrast  Result Date: 04/28/2016 CLINICAL DATA:  82 year old male with generalized weakness and fall. Jaundice. EXAM: CT ABDOMEN AND PELVIS WITHOUT CONTRAST TECHNIQUE: Multidetector CT imaging of the abdomen and pelvis was performed following the standard protocol without IV contrast. COMPARISON:  Abdominal radiograph dated 04/28/2016 FINDINGS: Evaluation of this exam is limited in the absence of intravenous contrast. Lower chest: Minimal hazy density in the lingula, likely atelectatic changes. The visualized lung bases are otherwise clear. There is moderate cardiomegaly with coronary vascular calcification. There is hypoattenuation of the cardiac blood pool suggestive of a degree of  anemia. Clinical correlation is recommended. No intra-abdominal free air or free fluid. Hepatobiliary: Diffuse fatty infiltration of the liver. A 1 cm ill-defined hypodensity in the left lobe of the liver as well as an ill-defined hypodensity in the right lobe of the liver (series 201 image 22) are indeterminate. Although this may be related to a benign etiology underlying neoplastic process or metastatic disease are not excluded. MRI without and with contrast may provide better evaluation. No intrahepatic biliary ductal dilatation. The gallbladder is predominantly contracted. No calcified gallstone identified. There is mild haziness of the pericholecystic fat. Ultrasound may provide better evaluation of the gallbladder. Pancreas: Unremarkable. No pancreatic ductal dilatation or surrounding inflammatory changes. Spleen: Normal in size without focal abnormality. Adrenals/Urinary Tract: The adrenal glands appear unremarkable. Mild bilateral renal atrophy. There is no hydronephrosis or nephrolithiasis on either side. A 2 cm left renal upper pole hypodense lesion is not well characterized but demonstrates fluid attenuation and most likely represents a cyst. Ultrasound or MRI may provide better characterisation. The visualized ureters appear unremarkable. There is mild thickening and trabecular appearance of the bladder wall, likely related chronic bladder outlet obstruction. Correlation with urinalysis recommended to exclude UTI. Stomach/Bowel: Oral contrast opacifies the stomach and multiple loops of small bowel traversing into the colon. There is thickened appearance of the gastric wall, likely related to underdistention. Gastritis is less likely. Mild thickening of the jejunal folds in the left hemi abdomen may be related to underdistention. Clinical correlation is recommended to evaluate for enteritis. No evidence of bowel obstruction. Appendectomy. Vascular/Lymphatic: Advanced aortoiliac atherosclerotic disease.  Evaluation of the aorta and IVC is limited on this noncontrast study. No portal venous gas identified. There is no adenopathy. Reproductive: The prostate gland is  enlarged and heterogeneous with median lobe hypertrophy indenting the base of the bladder. The prostate measures approximately 6.5 cm in transverse diameter. Other: None Musculoskeletal: There is osteopenia with degenerative changes of the spine. Multiple old left posterior rib fractures. No acute fracture. IMPRESSION: Thickened appearance of the stomach and jejunal folds may be related to underdistention or represent gastroenteritis. Clinical correlation is recommended. No bowel obstruction. Minimal pericholecystic haziness. Ultrasound may provide better evaluation of the gallbladder. **An incidental finding of potential clinical significance has been found. Ill-defined hepatic hypodense lesions are not characterized. Nonemergent MRI without and with contrast is recommended for further characterization.** Enlarged prostate gland with evidence of chronic bladder outlet obstruction. Correlation with urinalysis recommended to exclude superimposed UTI. Electronically Signed   By: Anner Crete M.D.   On: 04/28/2016 23:52   Ct Head Wo Contrast  Result Date: 04/28/2016 CLINICAL DATA:  Generalized weakness.  Multiple recent falls. EXAM: CT HEAD WITHOUT CONTRAST TECHNIQUE: Contiguous axial images were obtained from the base of the skull through the vertex without intravenous contrast. COMPARISON:  04/21/2016 FINDINGS: Brain: Moderate cerebral atrophy is unchanged. Periventricular white matter hypodensities are unchanged and nonspecific but compatible with mild chronic small vessel ischemic disease. There is no evidence of acute cortical infarct, intracranial hemorrhage, mass, midline shift, or extra-axial fluid collection. Vascular: Calcified atherosclerosis at the skullbase. Skull: No fracture. Well-defined 2 cm sclerotic lesion in the right parietal  bone adjacent to the sagittal suture is unchanged. Sinuses/Orbits: Unchanged mild right sphenoid sinus mucosal thickening. Clear mastoid air cells. Prior bilateral cataract extraction. Other: None. IMPRESSION: 1. No evidence of acute intracranial abnormality. 2. Mild chronic small vessel ischemic disease and moderate cerebral atrophy. Electronically Signed   By: Logan Bores M.D.   On: 04/28/2016 19:10   Dg Abd Portable 1 View  Result Date: 04/28/2016 CLINICAL DATA:  Generalized weakness. EXAM: PORTABLE ABDOMEN - 1 VIEW COMPARISON:  CT 12/11/2015. FINDINGS: Soft tissue structures are unremarkable. No bowel distention. Nondistended air-filled loops of small and large bowel are noted. Mild thickening of small-bowel loops noted left upper quadrant. Process such enteritis cannot be excluded. Aortoiliac atherosclerotic vascular calcification. IMPRESSION: 1. Mild thickening of left upper quadrant proximal small bowel loops cannot be excluded. A process such as enteritis cannot be completely excluded. Exam otherwise unremarkable. No evidence of free air. 2. Aortoiliac atherosclerotic vascular disease . Electronically Signed   By: Marcello Moores  Register   On: 04/28/2016 17:14   US Abdomen Limited Ruq  Result Date: 04/29/2016 CLINICAL DATA:  Elevated LFTs EXAM: US ABDOMEN LIMITED - RIGHT UPPER QUADRANT COMPARISON:  04/28/2016 FINDINGS: Gallbladder: No gallstones are noted within gallbladder. No thickening of gallbladder wall. No sonographic Murphy's sign. Common bile duct: Diameter: 6.2 mm in diameter Liver: There is diffuse increased echogenicity of the liver suspicious for fatty infiltration. Complex cyst in right hepatic lobe measures 1.9 x 1.8 cm. Further correlation with MRI is recommended. No intrahepatic biliary ductal dilatation. IMPRESSION: 1. No gallstones are noted within gallbladder. Diffuse increased echogenicity of the liver suspicious for fatty infiltration. Normal CBD. Complex cyst in right hepatic lobe  measures 1.9 cm. Further correlation with MRI is recommended. Electronically Signed   By: Lahoma Crocker M.D.   On: 04/29/2016 10:10               Blood pressure 117/71, pulse 77, temperature 97.9 F (36.6 C), temperature source Oral, resp. rate 18, height _0  (1.727 m), weight 70.7 kg (155 lb 14.4 oz), SpO2 94 %.  Physical  exam:   General-- pleasant white male with bleeding lesion on his right elbow wrapped in bandage ENT-- slight scleral icterus Neck-- no gross masses Heart-- no gross murmurs or gallops Lungs-- clear with no wheezes Abdomen-- soft and nontender with no clear ascites. Liver borders palpable Psych-- slightly confused but pleasant   Assessment: 1. Anemia. The etiology of this is unclear and may be multifactorial. He does not seem to be actively bleeding at this time. I think it would be reasonable to go ahead with EGD particularly given the thickened gastric folds seen on CT scan. 2. Alcoholic liver disease with fatty liver demonstrated on imaging. No gross cirrhosis on image. 3. Atrial fibrillation anticoagulated. Eliquis on hold due to his profound anemia 4. Alcoholism. He is exhibiting signs of alcohol withdrawal.  Plan: 1. You would hold the Eliquis since he is anticoagulated due to his liver disease. 2. Will proceed with EGD in the morning to evaluate the enlarged gastric folds in the stomach look for varices or other signs of a bleeding source. 3. Would continue him on them. PPI therapy 4. Alcohol withdrawal protocol.   Gedalia Mcmillon JR,Atiyana Welte L 04/29/2016, 2:48 PM   This note was created using voice recognition software and minor errors may Have occurred unintentionally. Pager: [phone] If no answer or after hours call [phone]

## 2016-04-30 NOTE — Progress Notes (Signed)
PROGRESS NOTE    Xavier Taylor  ZOX:[id number] DOB: 1930-05-11 DOA: 04/28/2016 PCP: Ginette Otto, MD  Brief Narrative: Xavier Taylor is an 82 y.o. male with a PMH of CAD status post CABG, chronic atrial fibrillation on blood thinners and Heavily Alcohol Abuse who was admitted 04/28/16 with a chief complaint of confusion and weakness. He underwent an EGD 04/30/2016.  Assessment & Plan:   Principal Problem:   Alcoholic liver disease (HCC) Active Problems:   CAD (coronary artery disease)   Atrial fibrillation, chronic (HCC)   Anemia   Hyponatremia   Hypokalemia   Hypomagnesemia   COPD (chronic obstructive pulmonary disease) (HCC)   Alcohol abuse   Acute liver failure   Coagulopathy (HCC)   Thrombocytopenia (HCC)   Hypothyroidism   Acute renal failure superimposed on stage 3 chronic kidney disease (HCC)  Alcoholic liver disease (HCC)/Acute liver failure with confusion and weakness -The patient's elevated LFTs, low protein/albumin, thrombocytopenia and elevated ammonia levels suggest fairly advanced alcohol-induced liver disease.  -CT of the abdomen showed fatty infiltration of the liver. Ill-defined hypodensity in the right lobe of the liver noted, neoplastic process could not be excluded.  -Ultrasound showed fatty liver and a complex cyst in the right hepatic lobe measuring 1.9 cm with recommendations for MRI for further correlation.  -MRI of Abdomen with and without Contrast ordered to Evaluate Hepatic Lobe Lesion -AST was 151 and ALT was 83 -GI consulted to assist with further workup/management. Appreciate Additional Recc's  Acute on chronic renal failure/stage III chronic kidney disease -Baseline creatinine is around 1.5 with a GFR of around 36.  -BUN/Cr went to 22/2.22 with estimated GFR of 30 -Gently hydrate.  -Will continue to Follow creatinine.  Coagulopathy/thrombocytopenia -Patient's PT/INR was 20.3/1.72 respectively -Patient lost blood from a skin  tear on his right elbow, and he likely has GI bleeding as well. -Was given 2 units of fresh frozen plasma and a dose of vitamin K.  CAD (coronary artery disease) -Given reports of dark stools, and markedly low hemoglobin, avoid aspirin.  -No complaints of chest pain.  -Statins on hold secondary to elevated LFTs.  Atrial fibrillation, chronic (HCC) -Blood thinners discontinued by admitting physician given elevated INR in the setting of alcohol abuse and high fall risk.  Anemia of chronic blood loss -S/p Transfusion of 1 unit of pRBC -Hb/Hct went to 7.2/20.4 -FOBT Negative however family reports of dark stools with blood. -EGD done today showed There is no endoscopic evidence of Barrett's esophagus, esophagitis, inflammation, stricture, ulcerations or varices in the entire esophagus. The stomach was normal. The examined duodenum was normal and there was no etiology evident for anemia on Endoscopic Evaluation -Discussed Case with Dr. Randa Evens in Gastroenterology and he recommends outpatient colonoscopy once patient stabilizes from Alcohol withdrawals.   Hypothyroidism -TSH markedly elevated at 19.74.  -Will Check free T4, on Synthroid 50 g daily.  -Suspect non-compliance.  Hyponatremia -Patient's Sodium was 132 -Possible from [employer]. -Repeat BMP in AM  Hypokalemia -Repleted.  Hypomagnesemia -Repleted.  COPD (chronic obstructive pulmonary disease) (HCC) -Controlled. Currently not in Exacerbation -C/w Albuterol NEbs  Alcohol abuse -Continue alcohol detox per [facility] protocol. -C/w MVI, Thiamine, and Folic Acid  DVT prophylaxis: SCDs as patient underwent EGD  Code Status: DNR Family Communication: Discussed case with wife, sons and daughter at bedside Disposition Plan: SNF  Consultants:   Gastroenterology  Procedures: EGD  Antimicrobials: None  Subjective: Seen and examined after EGD and was doing well. Had no complaints and no  N/V/Abdominal Pain. No  Chest Pain.   Objective: Vitals:   04/30/16 1041 04/30/16 1050 04/30/16 1100 04/30/16 1139  BP: (!) 134/46 (!) 142/57 (!) 126/44 135/62  Pulse: 75 (!) 57 77 88  Resp: 18 18 18    Temp: 98 F (36.7 C)     TempSrc: Oral     SpO2: 98% 96% 95%   Weight:      Height:        Intake/Output Summary (Last 24 hours) at 04/30/16 1230 Last data filed at 04/30/16 0547  Gross per 24 hour  Intake              952 ml  Output              300 ml  Net              652 ml   Filed Weights   04/28/16 1636 04/28/16 2334  Weight: 74.8 kg (165 lb) 70.7 kg (155 lb 14.4 oz)    Examination: Physical Exam:  Constitutional: WN/WD, NAD and appears calm and comfortable Eyes: slight icteric scleara.  ENMT: External Ears, Nose appear normal. Grossly normal hearing. Mucous membranes slightly dry. Poor dentition.  Neck: Appears normal, supple, no cervical masses, normal ROM, no appreciable thyromegaly, no JVD Respiratory: Clear to auscultation bilaterally, no wheezing, rales, rhonchi or crackles. Normal respiratory effort and patient is not tachypenic. No accessory muscle use.  Cardiovascular: RRR, no murmurs / rubs / gallops. S1 and S2 auscultated. No extremity edema. 2+ pedal pulses.  Abdomen: Soft, non-tender, non-distended. No masses palpated. No appreciable hepatosplenomegaly. Bowel sounds positive x4.  GU: Deferred. Musculoskeletal: No clubbing / cyanosis of digits/nails. No joint deformity upper and lower extremities. No contractures. Normal strength and muscle tone.  Skin: No rashes, lesions, ulcers. No induration; Warm and dry.  Neurologic: CN 2-12 grossly intact with no focal deficits. Sensation intact in all 4 Extremities. Strength 5/5 in all 4. Romberg sign cerebellar reflexes not assessed.  Psychiatric: Normal judgment and insight. Alert and oriented x 3. Normal mood and flat affect.   Data Reviewed: I have personally reviewed following labs and imaging studies  CBC:  Recent Labs Lab  04/28/16 1647 04/28/16 1648 04/29/16 0340 04/29/16 1211 04/30/16 0730  WBC 7.5  --  6.8  --  6.4  NEUTROABS 5.8  --   --   --  5.0  HGB 7.1* 8.2* 6.4* 7.8* 7.2*  HCT 19.8* 24.0* 17.9* 22.1* 20.4*  MCV 96.6  --  95.7  --  93.6  PLT 115*  --  103*  --  112*   Basic Metabolic Panel:  Recent Labs Lab 04/28/16 1647 04/28/16 1648 04/28/16 2355 04/29/16 0340 04/30/16 0730  NA 128* 125*  --  124* 132*  K 3.3* 3.4*  --  3.2* 4.0  CL 89* 84*  --  88* 96*  CO2 27  --   --  23 25  GLUCOSE 122* 122*  --  100* 98  BUN 20 27*  --  23* 22*  CREATININE 2.81* 3.00*  --  2.72* 2.22*  CALCIUM 8.3*  --   --  8.0* 8.6*  MG 1.3*  --   --  2.0  --   PHOS  --   --  3.4 3.2  --    GFR: Estimated Creatinine Clearance: 24 mL/min (by C-G formula based on SCr of 2.22 mg/dL (H)). Liver Function Tests:  Recent Labs Lab 04/28/16 1647 04/29/16 0340 04/30/16 0730  AST 164*  142* 151*  ALT 90* 81* 83*  ALKPHOS 126 120 109  BILITOT 5.1* 4.5* 4.2*  PROT 5.7* 5.2* 5.6*  ALBUMIN 2.7* 2.6* 2.7*   No results for input(s): LIPASE, AMYLASE in the last 168 hours.  Recent Labs Lab 04/28/16 1647 04/29/16 0340  AMMONIA 48* 59*   Coagulation Profile:  Recent Labs Lab 04/28/16 1647 04/29/16 0340 04/30/16 0730  INR 2.90 2.90 1.72   Cardiac Enzymes: No results for input(s): CKTOTAL, CKMB, CKMBINDEX, TROPONINI in the last 168 hours. BNP (last 3 results)  Recent Labs  01/23/16 1521  PROBNP 650.0*   HbA1C: No results for input(s): HGBA1C in the last 72 hours. CBG: No results for input(s): GLUCAP in the last 168 hours. Lipid Profile:  Recent Labs  04/29/16 0340  CHOL 75  HDL 18*  LDLCALC 44  TRIG 67  CHOLHDL 4.2   Thyroid Function Tests:  Recent Labs  04/29/16 0340  TSH 19.713*   Anemia Panel:  Recent Labs  04/29/16 0340  VITAMINB12 2,202*  FOLATE 14.3  FERRITIN 815*  TIBC 113*  IRON 49  RETICCTPCT 3.7*   Sepsis Labs: No results for input(s): PROCALCITON,  LATICACIDVEN in the last 168 hours.  No results found for this or any previous visit (from the past 240 hour(s)).       Radiology Studies: Ct Abdomen Pelvis Wo Contrast  Result Date: 04/28/2016 CLINICAL DATA:  82 year old male with generalized weakness and fall. Jaundice. EXAM: CT ABDOMEN AND PELVIS WITHOUT CONTRAST TECHNIQUE: Multidetector CT imaging of the abdomen and pelvis was performed following the standard protocol without IV contrast. COMPARISON:  Abdominal radiograph dated 04/28/2016 FINDINGS: Evaluation of this exam is limited in the absence of intravenous contrast. Lower chest: Minimal hazy density in the lingula, likely atelectatic changes. The visualized lung bases are otherwise clear. There is moderate cardiomegaly with coronary vascular calcification. There is hypoattenuation of the cardiac blood pool suggestive of a degree of anemia. Clinical correlation is recommended. No intra-abdominal free air or free fluid. Hepatobiliary: Diffuse fatty infiltration of the liver. A 1 cm ill-defined hypodensity in the left lobe of the liver as well as an ill-defined hypodensity in the right lobe of the liver (series 201 image 22) are indeterminate. Although this may be related to a benign etiology underlying neoplastic process or metastatic disease are not excluded. MRI without and with contrast may provide better evaluation. No intrahepatic biliary ductal dilatation. The gallbladder is predominantly contracted. No calcified gallstone identified. There is mild haziness of the pericholecystic fat. Ultrasound may provide better evaluation of the gallbladder. Pancreas: Unremarkable. No pancreatic ductal dilatation or surrounding inflammatory changes. Spleen: Normal in size without focal abnormality. Adrenals/Urinary Tract: The adrenal glands appear unremarkable. Mild bilateral renal atrophy. There is no hydronephrosis or nephrolithiasis on either side. A 2 cm left renal upper pole hypodense lesion is not  well characterized but demonstrates fluid attenuation and most likely represents a cyst. Ultrasound or MRI may provide better characterisation. The visualized ureters appear unremarkable. There is mild thickening and trabecular appearance of the bladder wall, likely related chronic bladder outlet obstruction. Correlation with urinalysis recommended to exclude UTI. Stomach/Bowel: Oral contrast opacifies the stomach and multiple loops of small bowel traversing into the colon. There is thickened appearance of the gastric wall, likely related to underdistention. Gastritis is less likely. Mild thickening of the jejunal folds in the left hemi abdomen may be related to underdistention. Clinical correlation is recommended to evaluate for enteritis. No evidence of bowel obstruction. Appendectomy. Vascular/Lymphatic: Advanced  aortoiliac atherosclerotic disease. Evaluation of the aorta and IVC is limited on this noncontrast study. No portal venous gas identified. There is no adenopathy. Reproductive: The prostate gland is enlarged and heterogeneous with median lobe hypertrophy indenting the base of the bladder. The prostate measures approximately 6.5 cm in transverse diameter. Other: None Musculoskeletal: There is osteopenia with degenerative changes of the spine. Multiple old left posterior rib fractures. No acute fracture. IMPRESSION: Thickened appearance of the stomach and jejunal folds may be related to underdistention or represent gastroenteritis. Clinical correlation is recommended. No bowel obstruction. Minimal pericholecystic haziness. Ultrasound may provide better evaluation of the gallbladder. **An incidental finding of potential clinical significance has been found. Ill-defined hepatic hypodense lesions are not characterized. Nonemergent MRI without and with contrast is recommended for further characterization.** Enlarged prostate gland with evidence of chronic bladder outlet obstruction. Correlation with urinalysis  recommended to exclude superimposed UTI. Electronically Signed   By: Elgie CollardArash  Radparvar M.D.   On: 04/28/2016 23:52   Ct Head Wo Contrast  Result Date: 04/28/2016 CLINICAL DATA:  Generalized weakness.  Multiple recent falls. EXAM: CT HEAD WITHOUT CONTRAST TECHNIQUE: Contiguous axial images were obtained from the base of the skull through the vertex without intravenous contrast. COMPARISON:  04/21/2016 FINDINGS: Brain: Moderate cerebral atrophy is unchanged. Periventricular white matter hypodensities are unchanged and nonspecific but compatible with mild chronic small vessel ischemic disease. There is no evidence of acute cortical infarct, intracranial hemorrhage, mass, midline shift, or extra-axial fluid collection. Vascular: Calcified atherosclerosis at the skullbase. Skull: No fracture. Well-defined 2 cm sclerotic lesion in the right parietal bone adjacent to the sagittal suture is unchanged. Sinuses/Orbits: Unchanged mild right sphenoid sinus mucosal thickening. Clear mastoid air cells. Prior bilateral cataract extraction. Other: None. IMPRESSION: 1. No evidence of acute intracranial abnormality. 2. Mild chronic small vessel ischemic disease and moderate cerebral atrophy. Electronically Signed   By: Sebastian AcheAllen  Grady M.D.   On: 04/28/2016 19:10   Dg Abd Portable 1 View  Result Date: 04/28/2016 CLINICAL DATA:  Generalized weakness. EXAM: PORTABLE ABDOMEN - 1 VIEW COMPARISON:  CT 12/11/2015. FINDINGS: Soft tissue structures are unremarkable. No bowel distention. Nondistended air-filled loops of small and large bowel are noted. Mild thickening of small-bowel loops noted left upper quadrant. Process such enteritis cannot be excluded. Aortoiliac atherosclerotic vascular calcification. IMPRESSION: 1. Mild thickening of left upper quadrant proximal small bowel loops cannot be excluded. A process such as enteritis cannot be completely excluded. Exam otherwise unremarkable. No evidence of free air. 2. Aortoiliac  atherosclerotic vascular disease . Electronically Signed   By: Maisie Fushomas  Register   On: 04/28/2016 17:14   [place] Abdomen Limited Ruq  Result Date: 04/29/2016 CLINICAL DATA:  Elevated LFTs EXAM: US ABDOMEN LIMITED - RIGHT UPPER QUADRANT COMPARISON:  04/28/2016 FINDINGS: Gallbladder: No gallstones are noted within gallbladder. No thickening of gallbladder wall. No sonographic Murphy's sign. Common bile duct: Diameter: 6.2 mm in diameter Liver: There is diffuse increased echogenicity of the liver suspicious for fatty infiltration. Complex cyst in right hepatic lobe measures 1.9 x 1.8 cm. Further correlation with MRI is recommended. No intrahepatic biliary ductal dilatation. IMPRESSION: 1. No gallstones are noted within gallbladder. Diffuse increased echogenicity of the liver suspicious for fatty infiltration. Normal CBD. Complex cyst in right hepatic lobe measures 1.9 cm. Further correlation with MRI is recommended. Electronically Signed   By: Natasha MeadLiviu  Pop M.D.   On: 04/29/2016 10:10   Scheduled Meds: . carvedilol  12.5 mg Oral BID WC  . feeding supplement  1  Container Oral TID BM  . folic acid  1 mg Oral Daily  . levothyroxine  50 mcg Oral QAC breakfast  . mouth rinse  15 mL Mouth Rinse BID  . multivitamin with minerals  1 tablet Oral Daily  . pramipexole  0.25 mg Oral QHS  . tamsulosin  0.4 mg Oral Daily  . thiamine  100 mg Oral Daily   Or  . thiamine  100 mg Intravenous Daily   Continuous Infusions: . 0.9 % NaCl with KCl 40 mEq / L 75 mL/hr (04/30/16 0106)     LOS: 2 days   Merlene Laughter, DO [facility] Pager [phone]  If 7PM-7AM, please contact night-coverage [email or web address] Password [email or web address] 04/30/2016, 12:30 PM

## 2016-04-30 NOTE — Interval H&P Note (Signed)
History and Physical Interval Note:  04/30/2016 10:15 AM  Xavier RuddyJackie O Shanholtzer  has presented today for surgery, with the diagnosis of anemia  The various methods of treatment have been discussed with the patient and family. After consideration of risks, benefits and other options for treatment, the patient has consented to  Procedure(s): ESOPHAGOGASTRODUODENOSCOPY (EGD) (N/A) as a surgical intervention .  The patient's history has been reviewed, patient examined, no change in status, stable for surgery.  I have reviewed the patient's chart and labs.  Questions were answered to the patient's satisfaction.     Jamail Cullers JR,Mia Winthrop L

## 2016-05-01 ENCOUNTER — Inpatient Hospital Stay (HOSPITAL_COMMUNITY): Payer: Medicare Other

## 2016-05-01 DIAGNOSIS — F1010 Alcohol abuse, uncomplicated: Secondary | ICD-10-CM

## 2016-05-01 DIAGNOSIS — D689 Coagulation defect, unspecified: Secondary | ICD-10-CM

## 2016-05-01 LAB — COMPREHENSIVE METABOLIC PANEL
ALBUMIN: 2.7 g/dL — AB (ref 3.5–5.0)
ALK PHOS: 105 U/L (ref 38–126)
ALT: 93 U/L — AB (ref 17–63)
AST: 176 U/L — AB (ref 15–41)
Anion gap: 7 (ref 5–15)
BUN: 20 mg/dL (ref 6–20)
CHLORIDE: 102 mmol/L (ref 101–111)
CO2: 24 mmol/L (ref 22–32)
CREATININE: 1.98 mg/dL — AB (ref 0.61–1.24)
Calcium: 8.6 mg/dL — ABNORMAL LOW (ref 8.9–10.3)
GFR calc non Af Amer: 29 mL/min — ABNORMAL LOW (ref >60)
GFR, EST AFRICAN AMERICAN: 34 mL/min — AB (ref >60)
GLUCOSE: 104 mg/dL — AB (ref 65–99)
Potassium: 4.7 mmol/L (ref 3.5–5.1)
SODIUM: 133 mmol/L — AB (ref 135–145)
Total Bilirubin: 3.8 mg/dL — ABNORMAL HIGH (ref 0.3–1.2)
Total Protein: 5.4 g/dL — ABNORMAL LOW (ref 6.5–8.1)

## 2016-05-01 LAB — CBC WITH DIFFERENTIAL/PLATELET
BASOS ABS: 0 10*3/uL (ref 0.0–0.1)
BASOS PCT: 0 %
EOS ABS: 0.1 10*3/uL (ref 0.0–0.7)
EOS PCT: 1 %
HCT: 21.4 % — ABNORMAL LOW (ref 39.0–52.0)
HEMOGLOBIN: 7.3 g/dL — AB (ref 13.0–17.0)
Lymphocytes Relative: 15 %
Lymphs Abs: 1 10*3/uL (ref 0.7–4.0)
MCH: 32.7 pg (ref 26.0–34.0)
MCHC: 34.1 g/dL (ref 30.0–36.0)
MCV: 96 fL (ref 78.0–100.0)
Monocytes Absolute: 0.5 10*3/uL (ref 0.1–1.0)
Monocytes Relative: 7 %
NEUTROS PCT: 78 %
Neutro Abs: 5.4 10*3/uL (ref 1.7–7.7)
PLATELETS: 106 10*3/uL — AB (ref 150–400)
RBC: 2.23 MIL/uL — AB (ref 4.22–5.81)
RDW: 18.3 % — ABNORMAL HIGH (ref 11.5–15.5)
WBC: 6.9 10*3/uL (ref 4.0–10.5)

## 2016-05-01 LAB — AMMONIA: AMMONIA: 74 umol/L — AB (ref 9–35)

## 2016-05-01 LAB — PROTIME-INR
INR: 1.7
PROTHROMBIN TIME: 20.2 s — AB (ref 11.4–15.2)

## 2016-05-01 LAB — OCCULT BLOOD X 1 CARD TO LAB, STOOL: Fecal Occult Bld: POSITIVE — AB

## 2016-05-01 LAB — MAGNESIUM: Magnesium: 1.7 mg/dL (ref 1.7–2.4)

## 2016-05-01 LAB — T4, FREE: Free T4: 1.17 ng/dL — ABNORMAL HIGH (ref 0.61–1.12)

## 2016-05-01 LAB — PHOSPHORUS: PHOSPHORUS: 2.6 mg/dL (ref 2.5–4.6)

## 2016-05-01 MED ORDER — IPRATROPIUM-ALBUTEROL 0.5-2.5 (3) MG/3ML IN SOLN
3.0000 mL | Freq: Three times a day (TID) | RESPIRATORY_TRACT | Status: DC
  Administered 2016-05-01 – 2016-05-07 (×19): 3 mL via RESPIRATORY_TRACT
  Filled 2016-05-01 (×19): qty 3

## 2016-05-01 MED ORDER — IPRATROPIUM-ALBUTEROL 0.5-2.5 (3) MG/3ML IN SOLN: 3.0000 mL | Freq: Four times a day (QID) | RESPIRATORY_TRACT | Status: DC

## 2016-05-01 MED ORDER — METOCLOPRAMIDE HCL 5 MG/ML IJ SOLN
5.0000 mg | Freq: Once | INTRAMUSCULAR | Status: AC
  Administered 2016-05-01: 5 mg via INTRAVENOUS
  Filled 2016-05-01: qty 2

## 2016-05-01 NOTE — Progress Notes (Addendum)
Per Infection Prevention recommendation enteric precaution was D/C because patient had only a small  BM in the last 48hrs. MD aware. Will continue to monitor.

## 2016-05-01 NOTE — Progress Notes (Signed)
[facility] PROGRESS NOTE Subjective patient is off the floor down and x-ray according to the staff he did well overnight. EGD yesterday showed a normal stomach with no obvious calls for his anemia. He has been on anticoagulation, has cirrhosis with low platelets was not having any gross G.I. bleeding.  Objective: Vital signs in last 24 hours: Temp:  [98 F (36.7 C)-98.4 F (36.9 C)] 98.4 F (36.9 C) (10/25 2355) Pulse Rate:  [49-88] 79 (10/25 2355) Resp:  [17-25] 20 (10/25 2355) BP: (119-157)/(44-94) 124/50 (10/25 2355) SpO2:  [95 %-100 %] 99 % (10/25 2355) Last BM Date: 04/29/16  Intake/Output from previous day: 10/25 0701 - 10/26 0700 In: 2175 [I.V.:2175] Out: -  Intake/Output this shift: No intake/output data recorded.    Lab Results:  Recent Labs  04/28/16 1647 04/28/16 1648 04/29/16 0340 04/29/16 1211 04/30/16 0730 05/01/16 0442  WBC 7.5  --  6.8  --  6.4 6.9  HGB 7.1* 8.2* 6.4* 7.8* 7.2* 7.3*  HCT 19.8* 24.0* 17.9* 22.1* 20.4* 21.4*  PLT 115*  --  103*  --  112* 106*   BMET  Recent Labs  04/28/16 1647 04/28/16 1648 04/29/16 0340 04/30/16 0730 05/01/16 0442  NA 128* 125* 124* 132* 133*  K 3.3* 3.4* 3.2* 4.0 4.7  CL 89* 84* 88* 96* 102  CO2 27  --  23 25 24   CREATININE 2.81* 3.00* 2.72* 2.22* 1.98*   LFT  Recent Labs  04/29/16 0340 04/30/16 0730 05/01/16 0442  PROT 5.2* 5.6* 5.4*  AST 142* 151* 176*  ALT 81* 83* 93*  ALKPHOS 120 109 105  BILITOT 4.5* 4.2* 3.8*  BILIDIR  --  2.3*  --   IBILI  --  1.9*  --    PT/INR  Recent Labs  04/28/16 1647 04/29/16 0340 04/30/16 0730  LABPROT 30.9* 30.9* 20.3*  INR 2.90 2.90 1.72   PANCREAS No results for input(s): LIPASE in the last 72 hours.       Studies/Results: [place] Abdomen Limited Ruq  Result Date: 04/29/2016 CLINICAL DATA:  Elevated LFTs EXAM: US ABDOMEN LIMITED - RIGHT UPPER QUADRANT COMPARISON:  04/28/2016 FINDINGS: Gallbladder: No gallstones are noted within  gallbladder. No thickening of gallbladder wall. No sonographic Murphy's sign. Common bile duct: Diameter: 6.2 mm in diameter Liver: There is diffuse increased echogenicity of the liver suspicious for fatty infiltration. Complex cyst in right hepatic lobe measures 1.9 x 1.8 cm. Further correlation with MRI is recommended. No intrahepatic biliary ductal dilatation. IMPRESSION: 1. No gallstones are noted within gallbladder. Diffuse increased echogenicity of the liver suspicious for fatty infiltration. Normal CBD. Complex cyst in right hepatic lobe measures 1.9 cm. Further correlation with MRI is recommended. Electronically Signed   By: Natasha MeadLiviu  Pop M.D.   On: 04/29/2016 10:10    Medications: I have reviewed the patient's current medications.  Assessment/Plan: 1. Anemia. No gross source of bleeding the upper G.I. track and his stools were negative on one occasion. I feel that he will need a colonoscopy at some point in time when he has successfully detoxed from alcohol. We will go ahead and check his stools again 2. Alcoholic liver disease. Fatty liver on scanning and probably has underlying cirrhosis. Has a low platelet count. Liver test appear to be slowly improving 3. Alcoholism. He is going through a detox program and may need inpatient alcoholic rehab. 4. Atrial fibrillation. As previously anticoagulated but that is currently on hold.   Jenevieve Kirschbaum JR,Selicia Windom L 05/01/2016, 7:04 AM  This note  was created using voice recognition software. Minor errors may Have occurred unintentionally.  Pager: [phone] If no answer or after hours call [phone]

## 2016-05-01 NOTE — Care Management Important Message (Signed)
Important Message  Patient Details  Name: Xavier Taylor MRN: [id number] Date of Birth: Jul 20, 1929   Medicare Important Message Given:  Yes    Lawerance Sabalebbie Pattijo Juste, RN 05/01/2016, 11:37 AM

## 2016-05-01 NOTE — Progress Notes (Signed)
PROGRESS NOTE    [name]  WUJ:[id number] DOB: 26-Feb-1930 DOA: 04/28/2016 PCP: Ginette Otto, MD  Brief Narrative: Xavier Taylor is an 82 y.o. male with a PMH of CAD status post CABG, chronic atrial fibrillation on blood thinners and Heavily Alcohol Abuse who was admitted 04/28/16 with a chief complaint of confusion and weakness. He underwent an EGD 04/30/2016. He is now FOBT Positive. Will discuss with Gi.   Assessment & Plan:   Principal Problem:   Alcoholic liver disease (HCC) Active Problems:   CAD (coronary artery disease)   Atrial fibrillation, chronic (HCC)   Anemia   Hyponatremia   Hypokalemia   Hypomagnesemia   COPD (chronic obstructive pulmonary disease) (HCC)   Alcohol abuse   Acute liver failure   Coagulopathy (HCC)   Thrombocytopenia (HCC)   Hypothyroidism   Acute renal failure superimposed on stage 3 chronic kidney disease (HCC)  Alcoholic liver disease (HCC)/Acute liver failure with confusion and weakness -The patient's elevated LFTs, low protein/albumin, thrombocytopenia and elevated ammonia levels (now 74) suggest fairly advanced alcohol-induced liver diseases -CT of the abdomen showed fatty infiltration of the liver. Ill-defined hypodensity in the right lobe of the liver noted, neoplastic process could not be excluded.  -Ultrasound showed fatty liver and a complex cyst in the right hepatic lobe measuring 1.9 cm with recommendations for MRI for further correlation.  -MRI of Abdomen with and without Contrast ordered to Evaluate Hepatic Lobe Lesion and showed lesion in the central LEFT hepatic lobe has imaging characteristics most consistent with benign cyst. Hepatic steatosis.  No biliary obstruction.  Small amount of fluid along the second portion duodenum and mild pericholecystic fluid. Differential would include duodenitis, pancreatitis, or chronic cholecystitis. Pancreas does not appear inflamed. -AST was 151 and ALT was 83 -GI consulted to  assist with further workup/management. Appreciate Additional Recc's  Acute on chronic renal failure/stage III chronic kidney disease -Baseline creatinine is around 1.5 with a GFR of around 36.  -BUN/Cr went to 20/1.98 with estimated GFR of 30 -Gently hydrate with NS at 75 mL/hr.  -Will continue to Follow creatinine.   Coagulopathy/thrombocytopenia -Platelet Count went from 112 -> 106 -Patient's INR was 1.70 respectively -Patient lost blood from a skin tear on his right elbow, and he likely has GI bleeding as well. -Was given 2 units of fresh frozen plasma and a dose of vitamin K.  CAD (coronary artery disease) -Given reports of dark stools, and markedly low hemoglobin, avoid aspirin.  -No complaints of chest pain.  -Statins on hold secondary to elevated LFTs.  Atrial fibrillation, chronic (HCC) -Blood thinners discontinued by admitting physician given elevated INR in the setting of alcohol abuse and high fall risk. -Concern for GI Bleeding so held.   Anemia of chronic blood loss -S/p Transfusion of 1 unit of pRBC -Hb/Hct was 7.3/21.4  (7.2/20.4) -Initial FOBT Negative however family reports of dark stools with blood. Repeat FOBT was Positive. -EGD done showed There is no endoscopic evidence of Barrett's esophagus, esophagitis, inflammation, stricture, ulcerations or varices in the entire esophagus. The stomach was normal. The examined duodenum was normal and there was no etiology evident for anemia on Endoscopic Evaluation -Continue to Monitor H/H -Will likely need Colonoscopy. Will discuss with Dr. Randa Evens  Hypothyroidism -TSH markedly elevated at 19.74.; Free T4 1.17 -On Synthroid 50 g daily.  -Suspect non-compliance.  Hyponatremia -Patient's Sodium was 133 -Possible from [employer]. -Repeat BMP in AM  COPD (chronic obstructive pulmonary disease) (HCC) -Controlled. Currently not in  Exacerbation -C/w Albuterol Nebs -Scheduled DuoNEbs TID -Flutter Valve as  patient was slightly wheezing  Alcohol abuse -Continue alcohol detox per CIWA protocol. -C/w MVI, Thiamine, and Folic Acid  DVT prophylaxis: SCDs as patient underwent EGD  Code Status: DNR Family Communication: Discussed case with wife, sons and daughter at bedside Disposition Plan: SNF when stable for D/C  Consultants:   Gastroenterology  Procedures: EGD  Antimicrobials: None  Subjective: Seen and examined and states he did not have a great night. Had some more respiratory distress and was slightly wheezing. DuoNebs scheduled. No other complaints or concerns. Per Nurse patient had loose bowel movements with FOBT Positive. Will discuss with Dr. Randa Evens about potential Colonoscopy. Family at bedside and updated about plan of care.   Objective: Vitals:   05/01/16 1300 05/01/16 1428 05/01/16 1510 05/01/16 2011  BP:   (!) 146/64   Pulse: 84  89   Resp: (!) 30  18   Temp:   97.8 F (36.6 C)   TempSrc:   Oral   SpO2:  96% 97% 97%  Weight:      Height:        Intake/Output Summary (Last 24 hours) at 05/01/16 2014 Last data filed at 05/01/16 1832  Gross per 24 hour  Intake             3200 ml  Output                0 ml  Net             3200 ml   Filed Weights   04/28/16 1636 04/28/16 2334  Weight: 74.8 kg (165 lb) 70.7 kg (155 lb 14.4 oz)    Examination: Physical Exam:  Constitutional: WN/WD, Mild respiratory Distress Eyes: Slight icteric scleara.  ENMT: External Ears, Nose appear normal. Grossly normal hearing. Mucous membranes slightly dry. Poor dentition.  Neck: Appears normal, supple, no cervical masses, normal ROM, no appreciable thyromegaly, no JVD Respiratory:  Increased respiratory effort with mild wheezing, No rales, rhonchi or crackles. Mild accessory muscle use.  Cardiovascular: RRR, no murmurs / rubs / gallops. S1 and S2 auscultated. No extremity edema. 2+ pedal pulses.  Abdomen: Soft, non-tender, non-distended. No masses palpated. No appreciable  hepatosplenomegaly. Bowel sounds positive x4.  GU: Deferred. Dark Urine in foley bag.  Musculoskeletal: No clubbing / cyanosis of digits/nails. No joint deformity upper and lower extremities. No contractures. Normal strength and muscle tone.  Skin: No rashes, lesions, ulcers. No induration; Warm and dry.  Neurologic: CN 2-12 grossly intact with no focal deficits. Sensation intact in all 4 Extremities. Strength 5/5 in all 4. Romberg sign cerebellar reflexes not assessed.  Psychiatric: Normal judgment and insight. Alert and oriented x 3. Normal mood and flat affect.   Data Reviewed: I have personally reviewed following labs and imaging studies  CBC:  Recent Labs Lab 04/28/16 1647 04/28/16 1648 04/29/16 0340 04/29/16 1211 04/30/16 0730 05/01/16 0442  WBC 7.5  --  6.8  --  6.4 6.9  NEUTROABS 5.8  --   --   --  5.0 5.4  HGB 7.1* 8.2* 6.4* 7.8* 7.2* 7.3*  HCT 19.8* 24.0* 17.9* 22.1* 20.4* 21.4*  MCV 96.6  --  95.7  --  93.6 96.0  PLT 115*  --  103*  --  112* 106*   Basic Metabolic Panel:  Recent Labs Lab 04/28/16 1647 04/28/16 1648 04/28/16 2355 04/29/16 0340 04/30/16 0730 05/01/16 0442  NA 128* 125*  --  124* 132*  133*  K 3.3* 3.4*  --  3.2* 4.0 4.7  CL 89* 84*  --  88* 96* 102  CO2 27  --   --  23 25 24   GLUCOSE 122* 122*  --  100* 98 104*  BUN 20 27*  --  23* 22* 20  CREATININE 2.81* 3.00*  --  2.72* 2.22* 1.98*  CALCIUM 8.3*  --   --  8.0* 8.6* 8.6*  MG 1.3*  --   --  2.0  --  1.7  PHOS  --   --  3.4 3.2  --  2.6   GFR: Estimated Creatinine Clearance: 26.9 mL/min (by C-G formula based on SCr of 1.98 mg/dL (H)). Liver Function Tests:  Recent Labs Lab 04/28/16 1647 04/29/16 0340 04/30/16 0730 05/01/16 0442  AST 164* 142* 151* 176*  ALT 90* 81* 83* 93*  ALKPHOS 126 120 109 105  BILITOT 5.1* 4.5* 4.2* 3.8*  PROT 5.7* 5.2* 5.6* 5.4*  ALBUMIN 2.7* 2.6* 2.7* 2.7*   No results for input(s): LIPASE, AMYLASE in the last 168 hours.  Recent Labs Lab 04/28/16 1647  04/29/16 0340 05/01/16 1046  AMMONIA 48* 59* 74*   Coagulation Profile:  Recent Labs Lab 04/28/16 1647 04/29/16 0340 04/30/16 0730 05/01/16 1046  INR 2.90 2.90 1.72 1.70   Cardiac Enzymes: No results for input(s): CKTOTAL, CKMB, CKMBINDEX, TROPONINI in the last 168 hours. BNP (last 3 results)  Recent Labs  01/23/16 1521  PROBNP 650.0*   HbA1C: No results for input(s): HGBA1C in the last 72 hours. CBG: No results for input(s): GLUCAP in the last 168 hours. Lipid Profile:  Recent Labs  04/29/16 0340  CHOL 75  HDL 18*  LDLCALC 44  TRIG 67  CHOLHDL 4.2   Thyroid Function Tests:  Recent Labs  04/29/16 0340 05/01/16 0442  TSH 19.713*  --   FREET4  --  1.17*   Anemia Panel:  Recent Labs  04/29/16 0340  VITAMINB12 2,202*  FOLATE 14.3  FERRITIN 815*  TIBC 113*  IRON 49  RETICCTPCT 3.7*   Sepsis Labs: No results for input(s): PROCALCITON, LATICACIDVEN in the last 168 hours.  No results found for this or any previous visit (from the past 240 hour(s)).   Radiology Studies: Mr Abdomen Wo Contrast  Result Date: 05/01/2016 CLINICAL DATA:  Indeterminate hepatic lesion on ultrasound. EXAM: MRI ABDOMEN WITHOUT CONTRAST TECHNIQUE: Multiplanar multisequence MR imaging was performed without the administration of intravenous contrast. COMPARISON:  Ultrasound 04/29/2016, CT 04/28/2016 FINDINGS: Lower chest:  Lung bases are clear. Hepatobiliary: Loss of signal intensity on opposed phase imaging (series 8) consistent hepatic steatosis. Within the central LEFT hepatic lobe 1.6 cm well-circumscribed lesion bilobed lesion is hyperintense on T2 weighted imaging (image 34 series 4. No IV contrast was administered to evaluate enhancement characteristics. No biliary duct dilatation. Gallbladder has mild wall thickening without distention. Mild pericholecystic fluid. No gallstones evident. Common bile duct normal caliber. Pancreas: Normal pancreatic parenchymal intensity. No  ductal dilatation or inflammation. Spleen: Normal spleen. Adrenals/urinary tract: Adrenal glands and kidneys are normal. Stomach/Bowel: Stomach is normal. There is some fluid along the second portion duodenum adjacent the gallbladder fossa. (Image 26, series 5 Vascular/Lymphatic: Abdominal aortic normal caliber. No retroperitoneal periportal lymphadenopathy. Mild venous collaterals in the retrocrural space Musculoskeletal: No aggressive osseous lesion IMPRESSION: 1. While no IV contrast administered, lesion in the central LEFT hepatic lobe has imaging characteristics most consistent with benign cyst. 2. Hepatic steatosis.  No biliary obstruction. 3. Small amount of fluid  along the second portion duodenum and mild pericholecystic fluid. Differential would include duodenitis, pancreatitis, or chronic cholecystitis. Pancreas does not appear inflamed. Electronically Signed   By: Genevive Bi M.D.   On: 05/01/2016 10:03   Scheduled Meds: . carvedilol  12.5 mg Oral BID WC  . feeding supplement  1 Container Oral TID BM  . folic acid  1 mg Oral Daily  . ipratropium-albuterol  3 mL Nebulization TID  . levothyroxine  50 mcg Oral QAC breakfast  . mouth rinse  15 mL Mouth Rinse BID  . multivitamin with minerals  1 tablet Oral Daily  . pramipexole  0.25 mg Oral QHS  . tamsulosin  0.4 mg Oral Daily  . thiamine  100 mg Oral Daily   Continuous Infusions: . 0.9 % NaCl with KCl 40 mEq / L 75 mL/hr (05/01/16 0501)     LOS: 3 days   Merlene Laughter, DO [facility] Pager [phone]  If 7PM-7AM, please contact night-coverage [email or web address] Password [email or web address] 05/01/2016, 8:14 PM

## 2016-05-01 NOTE — Progress Notes (Signed)
OT Cancellation Note  Patient Details Name: Xavier Taylor MRN: [id number] DOB: 08/29/1929   Cancelled Treatment:    Reason Eval/Treat Not Completed: Medical issues which prohibited therapy.  Pt with nausea and vomiting.  Will reattempt.  Reynolds AmericanWendi [name], OTR/L [phone]   Xavier Taylor, Xavier Taylor 05/01/2016, 4:04 PM

## 2016-05-01 NOTE — Progress Notes (Signed)
Per MD verbal order we reinitiated enteric precaution, patient having two loose stools. Will continue to monitor.

## 2016-05-01 NOTE — Plan of Care (Signed)
Problem: Phase I Progression Outcomes Goal: Dyspnea controlled at rest Outcome: Progressing Patient returned from xray and found to be tachypneic, dyspneic, using accessory muscles, and wheezing. Administered PRN nebulizer treatment. Wheezes diminished, vital signs stable with O2 sat 99% on room air after treatment. Will continue to monitor. Xavier Taylor HumanBianca Morgane Taylor, Student-RN

## 2016-05-01 NOTE — Progress Notes (Signed)
MD notified about hemoculture being positive and Hgb low. Will continue to monitor.

## 2016-05-02 ENCOUNTER — Inpatient Hospital Stay (HOSPITAL_COMMUNITY): Payer: Medicare Other

## 2016-05-02 DIAGNOSIS — D72829 Elevated white blood cell count, unspecified: Secondary | ICD-10-CM

## 2016-05-02 LAB — URINALYSIS, ROUTINE W REFLEX MICROSCOPIC
Glucose, UA: NEGATIVE mg/dL
KETONES UR: 15 mg/dL — AB
NITRITE: NEGATIVE
PH: 7.5 (ref 5.0–8.0)
Protein, ur: 30 mg/dL — AB
Specific Gravity, Urine: 1.014 (ref 1.005–1.030)

## 2016-05-02 LAB — TYPE AND SCREEN
ABO/RH(D): A POS
Antibody Screen: NEGATIVE
UNIT DIVISION: 0
Unit division: 0

## 2016-05-02 LAB — COMPREHENSIVE METABOLIC PANEL
ALBUMIN: 2.9 g/dL — AB (ref 3.5–5.0)
ALT: 97 U/L — ABNORMAL HIGH (ref 17–63)
AST: 161 U/L — AB (ref 15–41)
Alkaline Phosphatase: 105 U/L (ref 38–126)
Anion gap: 11 (ref 5–15)
BUN: 20 mg/dL (ref 6–20)
CHLORIDE: 106 mmol/L (ref 101–111)
CO2: 21 mmol/L — ABNORMAL LOW (ref 22–32)
Calcium: 8.9 mg/dL (ref 8.9–10.3)
Creatinine, Ser: 2.02 mg/dL — ABNORMAL HIGH (ref 0.61–1.24)
GFR calc Af Amer: 33 mL/min — ABNORMAL LOW (ref >60)
GFR calc non Af Amer: 29 mL/min — ABNORMAL LOW (ref >60)
GLUCOSE: 121 mg/dL — AB (ref 65–99)
POTASSIUM: 5.2 mmol/L — AB (ref 3.5–5.1)
Sodium: 138 mmol/L (ref 135–145)
Total Bilirubin: 3.8 mg/dL — ABNORMAL HIGH (ref 0.3–1.2)
Total Protein: 5.7 g/dL — ABNORMAL LOW (ref 6.5–8.1)

## 2016-05-02 LAB — CBC WITH DIFFERENTIAL/PLATELET
BASOS ABS: 0 10*3/uL (ref 0.0–0.1)
BASOS PCT: 0 %
EOS PCT: 0 %
Eosinophils Absolute: 0 10*3/uL (ref 0.0–0.7)
HCT: 23.3 % — ABNORMAL LOW (ref 39.0–52.0)
Hemoglobin: 7.6 g/dL — ABNORMAL LOW (ref 13.0–17.0)
Lymphocytes Relative: 11 %
Lymphs Abs: 1.5 10*3/uL (ref 0.7–4.0)
MCH: 32.8 pg (ref 26.0–34.0)
MCHC: 32.6 g/dL (ref 30.0–36.0)
MCV: 100.4 fL — ABNORMAL HIGH (ref 78.0–100.0)
MONO ABS: 0.8 10*3/uL (ref 0.1–1.0)
Monocytes Relative: 6 %
NEUTROS ABS: 11.6 10*3/uL — AB (ref 1.7–7.7)
Neutrophils Relative %: 83 %
PLATELETS: 148 10*3/uL — AB (ref 150–400)
RBC: 2.32 MIL/uL — ABNORMAL LOW (ref 4.22–5.81)
RDW: 19.1 % — AB (ref 11.5–15.5)
WBC: 13.9 10*3/uL — ABNORMAL HIGH (ref 4.0–10.5)

## 2016-05-02 LAB — URINE MICROSCOPIC-ADD ON

## 2016-05-02 LAB — OCCULT BLOOD X 1 CARD TO LAB, STOOL: FECAL OCCULT BLD: POSITIVE — AB

## 2016-05-02 LAB — MAGNESIUM: MAGNESIUM: 1.7 mg/dL (ref 1.7–2.4)

## 2016-05-02 LAB — PHOSPHORUS: Phosphorus: 2.9 mg/dL (ref 2.5–4.6)

## 2016-05-02 MED ORDER — FUROSEMIDE 10 MG/ML IJ SOLN
20.0000 mg | Freq: Once | INTRAMUSCULAR | Status: AC
  Administered 2016-05-02: 20 mg via INTRAVENOUS
  Filled 2016-05-02: qty 2

## 2016-05-02 MED ORDER — ACETAMINOPHEN 325 MG PO TABS
650.0000 mg | ORAL_TABLET | Freq: Four times a day (QID) | ORAL | Status: DC | PRN
  Administered 2016-05-05: 650 mg via ORAL
  Filled 2016-05-02: qty 2

## 2016-05-02 MED ORDER — PIPERACILLIN-TAZOBACTAM 3.375 G IVPB
3.3750 g | Freq: Three times a day (TID) | INTRAVENOUS | Status: DC
Start: 2016-05-02 — End: 2016-05-05
  Administered 2016-05-02 – 2016-05-05 (×9): 3.375 g via INTRAVENOUS
  Filled 2016-05-02 (×10): qty 50

## 2016-05-02 MED ORDER — IOPAMIDOL (ISOVUE-300) INJECTION 61%
15.0000 mL | INTRAVENOUS | Status: AC
  Administered 2016-05-02 (×2): 15 mL via ORAL

## 2016-05-02 MED ORDER — SODIUM CHLORIDE 0.9 % IV SOLN
INTRAVENOUS | Status: DC
  Administered 2016-05-02: 1000 mL via INTRAVENOUS

## 2016-05-02 NOTE — Progress Notes (Signed)
PROGRESS NOTE    Xavier Taylor  ZOX:[id number] DOB: 1930/02/26 DOA: 04/28/2016 PCP: Ginette Otto, MD  Brief Narrative: Xavier Taylor is an 82 y.o. male with a PMH of CAD status post CABG, chronic atrial fibrillation on blood thinners and Heavily Alcohol Abuse who was admitted 04/28/16 with a chief complaint of confusion and weakness. He underwent an EGD 04/30/2016. He is now FOBT Positive. Patient spiked a white count and had imaging showing contracted gallbladder so General Surgery was consulted.   Assessment & Plan:   Principal Problem:   Alcoholic liver disease (HCC) Active Problems:   CAD (coronary artery disease)   Atrial fibrillation, chronic (HCC)   Anemia   Hyponatremia   Hypokalemia   Hypomagnesemia   COPD (chronic obstructive pulmonary disease) (HCC)   Alcohol abuse   Acute liver failure   Coagulopathy (HCC)   Thrombocytopenia (HCC)   Hypothyroidism   Acute renal failure superimposed on stage 3 chronic kidney disease (HCC)  Leukocytosis with unclear Etiology suspected from Cholecystitis  -WBC went from 6 -> 13.9 and patient had mild temperature -Blood Cx, Urine Cx and UA ordered -CXR ordered and showed Cardiomegaly with moderate pulmonary vascular congestion bilateral pleural effusions compatible with congestive heart failure. Mild bibasilar airspace disease likely reflects atelectasis. Infection is not excluded. -CT of Abdomen and Pelvis showed Small right pleural effusion, new since prior study. Bibasilar atelectasis. Diffuse fatty infiltration of the liver. Continued ill-defined low-density area within the liver in the left hepatic lobe, which cannot be characterized without intravenous contrast. May consider further evaluation with MRI.  Continued mildly contracted gallbladder with stranding in the pericholecystic fat.  -Ordered U/S of GallBladder and Consulted General Surgery for possible evaluation -Obtain Lipase Level  -LFT's elevated -Started  Patient on IV Zosyn  Alcoholic liver disease (HCC)/Acute liver failure with confusion and weakness -The patient's elevated LFTs, low protein/albumin, thrombocytopenia and elevated ammonia levels (now 74) suggest fairly advanced alcohol-induced liver diseases -CT of the abdomen showed fatty infiltration of the liver. Ill-defined hypodensity in the right lobe of the liver noted, neoplastic process could not be excluded.  -Ultrasound showed fatty liver and a complex cyst in the right hepatic lobe measuring 1.9 cm with recommendations for MRI for further correlation.  -MRI of Abdomen with and without Contrast ordered to Evaluate Hepatic Lobe Lesion and showed lesion in the central LEFT hepatic lobe has imaging characteristics most consistent with benign cyst. Hepatic steatosis.  No biliary obstruction.  Small amount of fluid along the second portion duodenum and mild pericholecystic fluid. Differential would include duodenitis, pancreatitis, or chronic cholecystitis. Pancreas does not appear inflamed. -GI consulted to assist with further workup/management. Appreciate Additional Recc's  Acute on chronic renal failure/stage III chronic kidney disease -Baseline creatinine is around 1.5 with a GFR of around 36.  -BUN/Cr went to 20/2.02 with estimated GFR of 33 -D/C'd IVF NS at 75 mL/hr as he appeared volume overloaded -Gave IV Lasix Once -Will continue to Follow creatinine.   Coagulopathy/thrombocytopenia -Platelet Count went from 112 -> 106 -> 148 -Patient's INR was 1.70 yesterday -Patient lost blood from a skin tear on his right elbow, and he likely has GI bleeding as well. -Was given 2 units of fresh frozen plasma and a dose of vitamin K.  CAD (coronary artery disease) -Given reports of dark stools, and markedly low hemoglobin, avoid aspirin.  -No complaints of chest pain.  -Statins on hold secondary to elevated LFTs.  Atrial fibrillation, chronic (HCC) -Blood thinners discontinued by  admitting physician given elevated INR in the setting of alcohol abuse and high fall risk. -Concern for GI Bleeding so held.   Anemia of chronic blood loss -S/p Transfusion of 1 unit of pRBC -Hb/Hct was 7.6/23.3 -Initial FOBT Negative however family reports of dark stools with blood. Repeat FOBT was Positive x2 now -EGD done showed There is no endoscopic evidence of Barrett's esophagus, esophagitis, inflammation, stricture, ulcerations or varices in the entire esophagus. The stomach was normal. The examined duodenum was normal and there was no etiology evident for anemia on Endoscopic Evaluation -Continue to Monitor H/H -Will likely need Colonoscopy. Will discuss with Dr. Randa Evens  Hypothyroidism -TSH markedly elevated at 19.74.; Free T4 1.17 -On Synthroid 50 g daily.  -Suspect non-compliance.  Hyponatremia, improved -Patient's Sodium was 133 -> 138 -Possible from [employer]. -Repeat BMP in AM  COPD (chronic obstructive pulmonary disease) with possible Volume Overload -Stopped IVF and Gave Dose of Lasix.  -Controlled. Currently not in Exacerbation -C/w Albuterol Nebs -Scheduled DuoNEbs TID -Flutter Valve as patient was slightly wheezing  Alcohol abuse -Continue alcohol detox per CIWA protocol. -C/w MVI, Thiamine, and Folic Acid  DVT prophylaxis: SCDs as patient underwent EGD  Code Status: DNR Family Communication: Discussed case with wife, sons and daughter at bedside Disposition Plan: SNF when stable for D/C  Consultants:   Gastroenterology  General Surgery  Procedures: EGD  Antimicrobials: IV Zosyn  Subjective: Seen and examined and daughter states he got more confused last night and pulled out Iv's. Had to be placed in [place]. Had an elevated WBC and had some belly tenderness on exam. Admitted to Breathing better after lasix injection. No other complaints or concerns as patient was resting. Family updated at bedside.   Objective: Vitals:   05/01/16 2011  05/01/16 2138 05/02/16 0524 05/02/16 1701  BP:  138/82 (!) 124/97 119/86  Pulse:  79 85 100  Resp:  18 19 20   Temp:  99.5 F (37.5 C) (!) 100.4 F (38 C) 98.9 F (37.2 C)  TempSrc:  Oral Oral Axillary  SpO2: 97% 98% 93% 98%  Weight:   70.5 kg (155 lb 6.8 oz)   Height:        Intake/Output Summary (Last 24 hours) at 05/02/16 2157 Last data filed at 05/02/16 2126  Gross per 24 hour  Intake          1558.75 ml  Output              900 ml  Net           658.75 ml   Filed Weights   04/28/16 1636 04/28/16 2334 05/02/16 0524  Weight: 74.8 kg (165 lb) 70.7 kg (155 lb 14.4 oz) 70.5 kg (155 lb 6.8 oz)    Examination: Physical Exam:  Constitutional: WN/WD, Mild respiratory Distress Eyes: Slight icteric scleara.  ENMT: External Ears, Nose appear normal. Grossly normal hearing. Mucous membranes slightly dry. Poor dentition.  Neck: Appears normal, supple, no cervical masses, normal ROM, no appreciable thyromegaly, no JVD Respiratory:  Increased respiratory effort with mild wheezing, No rales, rhonchi or crackles. Mild accessory muscle use.  Cardiovascular: RRR, no murmurs / rubs / gallops. S1 and S2 auscultated. No extremity edema. 2+ pedal pulses.  Abdomen: Soft, non-tender, non-distended. No masses palpated. No appreciable hepatosplenomegaly. Bowel sounds positive x4.  GU: Deferred. Condom Cath in place with Dark Urine in foley bag.  Musculoskeletal: No clubbing / cyanosis of digits/nails. No joint deformity upper and lower extremities. No contractures. Normal strength  and muscle tone.  Skin: No rashes, lesions, ulcers. No induration; Warm and dry.  Neurologic: CN 2-12 grossly intact with no focal deficits. Sensation intact in all 4 Extremities. Strength 5/5 in all 4. Romberg sign cerebellar reflexes not assessed.  Psychiatric: Normal judgment and insight. Alert and oriented x 3. Normal mood and flat affect.   Data Reviewed: I have personally reviewed following labs and imaging  studies  CBC:  Recent Labs Lab 04/28/16 1647  04/29/16 0340 04/29/16 1211 04/30/16 0730 05/01/16 0442 05/02/16 0622  WBC 7.5  --  6.8  --  6.4 6.9 13.9*  NEUTROABS 5.8  --   --   --  5.0 5.4 11.6*  HGB 7.1*  < > 6.4* 7.8* 7.2* 7.3* 7.6*  HCT 19.8*  < > 17.9* 22.1* 20.4* 21.4* 23.3*  MCV 96.6  --  95.7  --  93.6 96.0 100.4*  PLT 115*  --  103*  --  112* 106* 148*  < > = values in this interval not displayed. Basic Metabolic Panel:  Recent Labs Lab 04/28/16 1647 04/28/16 1648 04/28/16 2355 04/29/16 0340 04/30/16 0730 05/01/16 0442 05/02/16 0622  NA 128* 125*  --  124* 132* 133* 138  K 3.3* 3.4*  --  3.2* 4.0 4.7 5.2*  CL 89* 84*  --  88* 96* 102 106  CO2 27  --   --  23 25 24  21*  GLUCOSE 122* 122*  --  100* 98 104* 121*  BUN 20 27*  --  23* 22* 20 20  CREATININE 2.81* 3.00*  --  2.72* 2.22* 1.98* 2.02*  CALCIUM 8.3*  --   --  8.0* 8.6* 8.6* 8.9  MG 1.3*  --   --  2.0  --  1.7 1.7  PHOS  --   --  3.4 3.2  --  2.6 2.9   GFR: Estimated Creatinine Clearance: 26.3 mL/min (by C-G formula based on SCr of 2.02 mg/dL (H)). Liver Function Tests:  Recent Labs Lab 04/28/16 1647 04/29/16 0340 04/30/16 0730 05/01/16 0442 05/02/16 0622  AST 164* 142* 151* 176* 161*  ALT 90* 81* 83* 93* 97*  ALKPHOS 126 120 109 105 105  BILITOT 5.1* 4.5* 4.2* 3.8* 3.8*  PROT 5.7* 5.2* 5.6* 5.4* 5.7*  ALBUMIN 2.7* 2.6* 2.7* 2.7* 2.9*   No results for input(s): LIPASE, AMYLASE in the last 168 hours.  Recent Labs Lab 04/28/16 1647 04/29/16 0340 05/01/16 1046  AMMONIA 48* 59* 74*   Coagulation Profile:  Recent Labs Lab 04/28/16 1647 04/29/16 0340 04/30/16 0730 05/01/16 1046  INR 2.90 2.90 1.72 1.70   Cardiac Enzymes: No results for input(s): CKTOTAL, CKMB, CKMBINDEX, TROPONINI in the last 168 hours. BNP (last 3 results)  Recent Labs  01/23/16 1521  PROBNP 650.0*   HbA1C: No results for input(s): HGBA1C in the last 72 hours. CBG: No results for input(s): GLUCAP in  the last 168 hours. Lipid Profile: No results for input(s): CHOL, HDL, LDLCALC, TRIG, CHOLHDL, LDLDIRECT in the last 72 hours. Thyroid Function Tests:  Recent Labs  05/01/16 0442  FREET4 1.17*   Anemia Panel: No results for input(s): VITAMINB12, FOLATE, FERRITIN, TIBC, IRON, RETICCTPCT in the last 72 hours. Sepsis Labs: No results for input(s): PROCALCITON, LATICACIDVEN in the last 168 hours.  Recent Results (from the past 240 hour(s))  Culture, blood (Routine X 2) w Reflex to ID Panel     Status: None (Preliminary result)   Collection Time: 05/02/16  8:55 AM  Result Value  Ref Range Status   Specimen Description BLOOD LEFT ANTECUBITAL  Final   Special Requests IN PEDIATRIC BOTTLE 1CC  Final   Culture PENDING  Incomplete   Report Status PENDING  Incomplete     Radiology Studies: Ct Abdomen Pelvis Wo Contrast  Result Date: 05/02/2016 CLINICAL DATA:  Elevated white blood cell count.  Low hemoglobin. EXAM: CT ABDOMEN AND PELVIS WITHOUT CONTRAST TECHNIQUE: Multidetector CT imaging of the abdomen and pelvis was performed following the standard protocol without IV contrast. COMPARISON:  04/28/2016 FINDINGS: Lower chest: Small right pleural effusion, new since prior study. Bibasilar atelectasis. Heart is enlarged. Prior CABG. Hepatobiliary: Diffuse fatty infiltration of the liver. Low-density area again noted anteriorly within the left lobe, unchanged. No other focal abnormalities visualized. No biliary ductal dilatation. Gallbladder contracted. Mild haziness in the pericholecystic fat again noted, stable. No visible stones. Pancreas: No focal abnormality or ductal dilatation. Spleen: No focal abnormality.  Normal size. Adrenals/Urinary Tract: Continued bladder wall thickening and trabeculation noted, likely related to chronic bladder outlet obstruction. No hydronephrosis. No focal renal abnormality visualized. Adrenal glands unremarkable. Stomach/Bowel: Stomach, large and small bowel grossly  unremarkable. Vascular/Lymphatic: Diffuse aortic and iliac calcifications. No aneurysm. Mildly prominent retrocrural and posterior mediastinal lymph nodes adjacent to the esophagus. Index retrocrural lymph node measures 18 mm in short axis diameter compared with 12 mm previously. No other retroperitoneal or mesenteric adenopathy. Reproductive: Marked enlargement of the prostate. Other: No free fluid or free air. Musculoskeletal: No acute bony abnormality or focal bone lesion. Degenerative changes in the lumbar spine. IMPRESSION: Small right pleural effusion, new since prior study. Bibasilar atelectasis. Diffuse fatty infiltration of the liver. Continued ill-defined low-density area within the liver in the left hepatic lobe, which cannot be characterized without intravenous contrast. May consider further evaluation with MRI. Continued mildly contracted gallbladder with stranding in the pericholecystic fat. If there is concern for cholecystitis, this would be better evaluated with ultrasound. Prostate enlargement. Bladder wall thickening likely related to chronic bladder outlet obstruction. Aortoiliac atherosclerosis. Electronically Signed   By: Charlett Nose M.D.   On: 05/02/2016 12:13   Dg Chest 2 View  Result Date: 05/02/2016 CLINICAL DATA:  Shortness breath.  CABG. EXAM: CHEST  2 VIEW COMPARISON:  CT the chest 12/11/2015 FINDINGS: Heart is enlarged. Median sternotomy for CABG is again noted. Moderate pulmonary vascular congestion is evident. Bilateral pleural effusions and lower lobe airspace disease are noted. The visualized soft tissues and bony thorax are unremarkable. IMPRESSION: 1. Cardiomegaly with moderate pulmonary vascular congestion bilateral pleural effusions compatible with congestive heart failure. 2. Mild bibasilar airspace disease likely reflects atelectasis. Infection is not excluded. Electronically Signed   By: Marin Roberts M.D.   On: 05/02/2016 12:19   Mr Abdomen Wo  Contrast  Result Date: 05/01/2016 CLINICAL DATA:  Indeterminate hepatic lesion on ultrasound. EXAM: MRI ABDOMEN WITHOUT CONTRAST TECHNIQUE: Multiplanar multisequence MR imaging was performed without the administration of intravenous contrast. COMPARISON:  Ultrasound 04/29/2016, CT 04/28/2016 FINDINGS: Lower chest:  Lung bases are clear. Hepatobiliary: Loss of signal intensity on opposed phase imaging (series 8) consistent hepatic steatosis. Within the central LEFT hepatic lobe 1.6 cm well-circumscribed lesion bilobed lesion is hyperintense on T2 weighted imaging (image 34 series 4. No IV contrast was administered to evaluate enhancement characteristics. No biliary duct dilatation. Gallbladder has mild wall thickening without distention. Mild pericholecystic fluid. No gallstones evident. Common bile duct normal caliber. Pancreas: Normal pancreatic parenchymal intensity. No ductal dilatation or inflammation. Spleen: Normal spleen. Adrenals/urinary tract: Adrenal glands  and kidneys are normal. Stomach/Bowel: Stomach is normal. There is some fluid along the second portion duodenum adjacent the gallbladder fossa. (Image 26, series 5 Vascular/Lymphatic: Abdominal aortic normal caliber. No retroperitoneal periportal lymphadenopathy. Mild venous collaterals in the retrocrural space Musculoskeletal: No aggressive osseous lesion IMPRESSION: 1. While no IV contrast administered, lesion in the central LEFT hepatic lobe has imaging characteristics most consistent with benign cyst. 2. Hepatic steatosis.  No biliary obstruction. 3. Small amount of fluid along the second portion duodenum and mild pericholecystic fluid. Differential would include duodenitis, pancreatitis, or chronic cholecystitis. Pancreas does not appear inflamed. Electronically Signed   By: Genevive BiStewart  Edmunds M.D.   On: 05/01/2016 10:03   Scheduled Meds: . carvedilol  12.5 mg Oral BID WC  . feeding supplement  1 Container Oral TID BM  . folic acid  1 mg Oral  Daily  . ipratropium-albuterol  3 mL Nebulization TID  . levothyroxine  50 mcg Oral QAC breakfast  . mouth rinse  15 mL Mouth Rinse BID  . multivitamin with minerals  1 tablet Oral Daily  . piperacillin-tazobactam (ZOSYN)  IV  3.375 g Intravenous Q8H  . pramipexole  0.25 mg Oral QHS  . tamsulosin  0.4 mg Oral Daily  . thiamine  100 mg Oral Daily   Continuous Infusions:     LOS: 4 days   Merlene Laughtermair Latif Janita Camberos, DO [facility] Pager [phone]  If 7PM-7AM, please contact night-coverage [email or web address] Password [email or web address] 05/02/2016, 9:57 PM

## 2016-05-02 NOTE — Progress Notes (Signed)
Physical Therapy Treatment Patient Details Name: Xavier Taylor MRN: [id number] DOB: May 19, 1930 Today's Date: 05/02/2016    History of Present Illness pt is an 82 y/o male with mh of CAD, s/CABG, Alcohol abuse, COPD, admitted to ED with confusion, fatigue and weakness over the last 1.5 weeks, with difficulty walking.    PT Comments    Patient limited by bowel incontinence with standing. Mod A overall for mobility this session. Current plan remains appropriate.   Follow Up Recommendations  SNF     Equipment Recommendations  None recommended by PT    Recommendations for Other Services       Precautions / Restrictions Precautions Precautions: Fall Restrictions Weight Bearing Restrictions: No    Mobility  Bed Mobility Overal bed mobility: Needs Assistance Bed Mobility: Supine to Sit;Sit to Supine     Supine to sit: Mod assist;HOB elevated     General bed mobility comments: multimodal cues for initiation of and attending to task; cues for sequencing; assist to elevate trunk into sitting and scoot hips to EOB; HOB elevated  Transfers Overall transfer level: Needs assistance Equipment used: Rolling walker (2 wheeled) Transfers: Sit to/from Stand Sit to Stand: Mod assist         General transfer comment: cues for hand placement and safe use of AD; assist to power up into standing and for balance in standing; pt incontinent of loose stool each trial; X2 trials  Ambulation/Gait             General Gait Details: unable this session due to bowel incontinence in standing   Stairs            Wheelchair Mobility    Modified Rankin (Stroke Patients Only)       Balance Overall balance assessment: Needs assistance Sitting-balance support: Feet supported;Bilateral upper extremity supported Sitting balance-Leahy Scale: Good Sitting balance - Comments: pt able to maintain sitting balance while weight shifting and performing single LE extension while getting  cleaned up   Standing balance support: Bilateral upper extremity supported Standing balance-Leahy Scale: Poor                      Cognition Arousal/Alertness: Awake/alert Behavior During Therapy: WFL for tasks assessed/performed Overall Cognitive Status: Impaired/Different from baseline Area of Impairment: Attention;Safety/judgement;Awareness;Problem solving   Current Attention Level: Focused     Safety/Judgement: Decreased awareness of deficits;Decreased awareness of safety Awareness: Intellectual Problem Solving: Decreased initiation;Difficulty sequencing;Requires verbal cues;Requires tactile cues      Exercises      General Comments General comments (skin integrity, edema, etc.): sons and wife present for session      Pertinent Vitals/Pain Pain Assessment: No/denies pain    Home Living                      Prior Function            PT Goals (current goals can now be found in the care plan section) Acute Rehab PT Goals Patient Stated Goal: none stated Progress towards PT goals: Progressing toward goals    Frequency    Min 3X/week      PT Plan Current plan remains appropriate    Co-evaluation             End of Session Equipment Utilized During Treatment: Gait belt Activity Tolerance: Patient tolerated treatment well Patient left: in bed;with call bell/phone within reach;with bed alarm set;with family/visitor present;with SCD's reapplied (in chair position)  Time: 1610-9604 PT Time Calculation (min) (ACUTE ONLY): 54 min  Charges:  $Therapeutic Activity: 38-52 mins $Self Care/Home Management: 8-22                    G Codes:      Derek Mound, PTA Pager: [phone]   05/02/2016, 3:55 PM

## 2016-05-02 NOTE — Progress Notes (Signed)
Pharmacy Antibiotic Note  Xavier Taylor is a 82 y.o. male admitted on 04/28/2016 with generalized weakness, now suspected to have cholecystitis.  Pharmacy has been consulted for Zosyn dosing for possible intra-abdominal infection. He has a history of CKD.   Plan: Zosyn 3.375g IV q8h (4 hour infusion).  Monitor renal function and clinical progress  Height: 5\' 8"  (172.7 cm) Weight: 155 lb 6.8 oz (70.5 kg) IBW/kg (Calculated) : 68.4  Temp (24hrs), Avg:100 F (37.8 C), Min:99.5 F (37.5 C), Max:100.4 F (38 C)   Recent Labs Lab 04/28/16 1647 04/28/16 1648 04/29/16 0340 04/30/16 0730 05/01/16 0442 05/02/16 0622  WBC 7.5  --  6.8 6.4 6.9 13.9*  CREATININE 2.81* 3.00* 2.72* 2.22* 1.98* 2.02*    Estimated Creatinine Clearance: 26.3 mL/min (by C-G formula based on SCr of 2.02 mg/dL (H)).    Allergies  Allergen Reactions  . Exelon [Rivastigmine] Other (See Comments)    Didn't work for patient    Antimicrobials this admission: Zosyn 10/27 >>   Dose adjustments this admission:   Microbiology results: 10/27 BCx: sent 10/27 UCx: sent    Thank you for allowing pharmacy to be a part of this patient's care.  Loura BackJennifer Defiance, PharmD, BCPS Clinical Pharmacist Phone for tonight [phone] Main pharmacy - [phone] 05/02/2016 4:19 PM

## 2016-05-02 NOTE — Progress Notes (Signed)
[facility] PROGRESS NOTE Subjective Patient agitated. He has mittens on to avoid pulling on lines etc.  Objective: Vital signs in last 24 hours: Temp:  [97.8 F (36.6 C)-100.4 F (38 C)] 100.4 F (38 C) (10/27 0524) Pulse Rate:  [79-89] 85 (10/27 0524) Resp:  [18-30] 19 (10/27 0524) BP: (124-146)/(60-97) 124/97 (10/27 0524) SpO2:  [93 %-98 %] 93 % (10/27 0524) Weight:  [70.5 kg (155 lb 6.8 oz)] 70.5 kg (155 lb 6.8 oz) (10/27 0524) Last BM Date: 05/01/16  Intake/Output from previous day: 10/26 0701 - 10/27 0700 In: 2005 [P.O.:280; I.V.:1725] Out: 350 [Urine:350] Intake/Output this shift: Total I/O In: 60 [P.O.:60] Out: 0   PE: General--agitated, combative and disoriented  Abdomen--soft and nontender  Lab Results:  Recent Labs  04/29/16 1211 04/30/16 0730 05/01/16 0442 05/02/16 0622  WBC  --  6.4 6.9 13.9*  HGB 7.8* 7.2* 7.3* 7.6*  HCT 22.1* 20.4* 21.4* 23.3*  PLT  --  112* 106* 148*   BMET  Recent Labs  04/30/16 0730 05/01/16 0442 05/02/16 0622  NA 132* 133* 138  K 4.0 4.7 5.2*  CL 96* 102 106  CO2 25 24 21*  CREATININE 2.22* 1.98* 2.02*   LFT  Recent Labs  04/30/16 0730 05/01/16 0442 05/02/16 0622  PROT 5.6* 5.4* 5.7*  AST 151* 176* 161*  ALT 83* 93* 97*  ALKPHOS 109 105 105  BILITOT 4.2* 3.8* 3.8*  BILIDIR 2.3*  --   --   IBILI 1.9*  --   --    PT/INR  Recent Labs  04/30/16 0730 05/01/16 1046  LABPROT 20.3* 20.2*  INR 1.72 1.70   PANCREAS No results for input(s): LIPASE in the last 72 hours.       Studies/Results: Mr Abdomen Wo Contrast  Result Date: 05/01/2016 CLINICAL DATA:  Indeterminate hepatic lesion on ultrasound. EXAM: MRI ABDOMEN WITHOUT CONTRAST TECHNIQUE: Multiplanar multisequence MR imaging was performed without the administration of intravenous contrast. COMPARISON:  Ultrasound 04/29/2016, CT 04/28/2016 FINDINGS: Lower chest:  Lung bases are clear. Hepatobiliary: Loss of signal intensity on opposed  phase imaging (series 8) consistent hepatic steatosis. Within the central LEFT hepatic lobe 1.6 cm well-circumscribed lesion bilobed lesion is hyperintense on T2 weighted imaging (image 34 series 4. No IV contrast was administered to evaluate enhancement characteristics. No biliary duct dilatation. Gallbladder has mild wall thickening without distention. Mild pericholecystic fluid. No gallstones evident. Common bile duct normal caliber. Pancreas: Normal pancreatic parenchymal intensity. No ductal dilatation or inflammation. Spleen: Normal spleen. Adrenals/urinary tract: Adrenal glands and kidneys are normal. Stomach/Bowel: Stomach is normal. There is some fluid along the second portion duodenum adjacent the gallbladder fossa. (Image 26, series 5 Vascular/Lymphatic: Abdominal aortic normal caliber. No retroperitoneal periportal lymphadenopathy. Mild venous collaterals in the retrocrural space Musculoskeletal: No aggressive osseous lesion IMPRESSION: 1. While no IV contrast administered, lesion in the central LEFT hepatic lobe has imaging characteristics most consistent with benign cyst. 2. Hepatic steatosis.  No biliary obstruction. 3. Small amount of fluid along the second portion duodenum and mild pericholecystic fluid. Differential would include duodenitis, pancreatitis, or chronic cholecystitis. Pancreas does not appear inflamed. Electronically Signed   By: Genevive Bi M.D.   On: 05/01/2016 10:03    Medications: I have reviewed the patient's current medications.  Assessment/Plan: 1. Anemia.stools positive. He will ultimately need colonoscopy but with alcohol withdrawal I don't think he's really a candidate for that at this time 2. Alcohol withdrawal syndrome 3. Abnormal liver test. Almost certainly due to alcoholic  liver disease. Have stabilized with abstinence and appear to be slowly improving. 4. Afib. Previously anticoagulated. Agree with holding anticoagulants with elevated INR.  Plan: at this  point I would let him complete EtOH withdrawal program and have him come back and for office visit to arrange outpatient colonoscopy. Please call us back for any further problems.   Jdyn Parkerson JR,Danise Dehne L 05/02/2016, 8:42 AM  This note was created using voice recognition software. Minor errors may Have occurred unintentionally.  Pager: [phone] If no answer or after hours call [phone]

## 2016-05-02 NOTE — Consult Note (Signed)
Xavier [facility] Consult Note  Xavier Taylor 1930/06/28  [id number].    Requesting MD: Dr. Chana Bode Chief Complaint/Reason for Consult: rule out acute cholecystitis   HPI:  82 y/o male with a PMH CAD s/p CABG, GERD, chronic afib on Eloquis, CKD (baseline SCr 1.2)and heavy alcohol abuse with alcoholic liver disease who was admitted on 04/28/16 with a cc confusion and weakness. Patient drinks about 24 oz of southern comfort daily.  Family has attempted to wean patients alcohol but he develops tremors and worsening confusion when they do this. Patient also complains of diarrhea. Denies melena.   ED workup significant for mild AST 142, ALT 81, t.bili 4.5, INR 2.9, Ammonia 59, vitamin B12 2,202, Ferritin 815, SCr 2.72, hgb 6.4, platelets 103. FOBT negative. CT head non-acute. CT Abd showed marked fatty liver and small amount of fluid along the second portion duodenum and mild pericholecystic fluid. US showed no gallstones.  Patient was admitted for GI work-up.  Anticoagulation held secondary to elevated INR. He has received 1 unit pRBCs and 2 units of FFP. hgb rose appropriately to 7.2. GI performed EDG 10/25 revealing normal esophagus, stomach, and duodenum and no gross GI bleed to explain his anemia.   On 10/27 patients WBC spiked to 13.9. Mild fevers. His guaiac/FOBT is now positive. UA with trace leukocytes/no nitrites/rare bacteria and Cx pending. Repeat CT showed "continued mildly contracted gallbladder with stranding in the pericholecystic fat". And RUQ U/S ordered to better evaluate gallbladder (pending). General [facility] has been asked to consult regarding possible cholecystitis.   Today he denies abdominal pain associated with meals. Denies PMH gallstones. Denies HA, CP, SOB, abdominal pain, melena, hematochezia, and hematemesis. Denies dysuria and hematuria. Past abdominal surgeries include an appendectomy when he was 82 years old.  ROS: All systems reviewed and otherwise negative  except for as above  Family History  Problem Relation Age of Onset  . Heart attack Father 96  . Diabetes Son   . Diabetes Son   . Heart attack Son 60  . Breast cancer Daughter   . Rheum arthritis Mother     Past Medical History:  Diagnosis Date  . A-fib ([place])   . Acute liver failure 04/28/2016  . Alcoholic liver disease ([place]) 04/28/2016  . Ascites   . BPH (benign prostatic hypertrophy)   . CAD (coronary artery disease)   . Chronic renal disease, stage III   . COPD (chronic obstructive pulmonary disease) ([place])   . Coronary artery disease   . Decreased GFR   . GERD with stricture   . Hypertension   . Hypothyroid   . Internal hemorrhoids   . Renal disorder   . S/P CABG x 5   . Thrombocytopenia ([place]) 04/29/2016    Past Surgical History:  Procedure Laterality Date  . APPENDECTOMY    . BACK [place]    . CARDIAC [facility]    . ESOPHAGOGASTRODUODENOSCOPY N/A 04/30/2016   Procedure: ESOPHAGOGASTRODUODENOSCOPY (EGD);  Surgeon: Laurence Spates, MD;  Location[facility] ENDOSCOPY;  Service: Endoscopy;  Laterality: N/A;    Social History:  reports that he quit smoking about 22 years ago. His smoking use included Cigarettes. He has a 80.00 pack-year smoking history. He has quit using smokeless tobacco. He reports that he drinks about 16.8 oz of alcohol per week . His drug history is not on file.  Allergies:  Allergies  Allergen Reactions  . Exelon [Rivastigmine] Other (See Comments)    Didn't work for patient    Medications Prior  to Admission  Medication Sig Dispense Refill  . carvedilol (COREG) 12.5 MG tablet Take 12.5 mg by mouth 2 (two) times daily with a meal.    . ELIQUIS 2.5 MG TABS tablet Take 2.5 mg by mouth daily.     . fluticasone (FLONASE) 50 MCG/ACT nasal spray Place 2 sprays into the nose daily.     . furosemide (LASIX) 40 MG tablet Take 40 mg by mouth daily.    [name] levothyroxine (SYNTHROID, LEVOTHROID) 50 MCG tablet Take 50 mcg by mouth daily.    . Multiple Vitamin  (MULTIVITAMIN WITH MINERALS) TABS tablet Take 1 tablet by mouth daily.    [name] omeprazole (PRILOSEC) 20 MG capsule Take 20 mg by mouth daily.     . pramipexole (MIRAPEX) 0.25 MG tablet Take 0.25 mg by mouth at bedtime.    . simvastatin (ZOCOR) 10 MG tablet Take 10 mg by mouth daily at 6 PM.     . Tamsulosin HCl (FLOMAX) 0.4 MG CAPS Take 0.4 mg by mouth daily.    . chlorproMAZINE (THORAZINE) 25 MG tablet Take 25 mg by mouth 3 (three) times daily as needed for hiccoughs.     . potassium chloride SA (K-DUR,KLOR-CON) 20 MEQ tablet Take 1 tablet (20 mEq total) by mouth 2 (two) times daily. (Patient not taking: Reported on 04/28/2016) 6 tablet 0    Blood pressure (!) 124/97, pulse 85, temperature (!) 100.4 F (38 C), temperature source Oral, resp. rate 19, height 5' 8"  (1.727 m), weight 155 lb 6.8 oz (70.5 kg), SpO2 93 %. Physical Exam: General: cooperative, somnolent, ill-appearing white male who is laying in bed in NAD HEENT: head is normocephalic, atraumatic. Heart: RRR no m/r/g Lungs: CTAB, decreased breath sounds at bases BL Abd: soft, NT/ND, +BS, +hepatomegaly, possible small umbilical hernia that is reducible MS: all 4 extremities are symmetrical with no cyanosis, clubbing, or edema. Skin: warm and dry with no masses, lesions, or rashes Neuro: A&Ox3, normal speech  Results for orders placed or performed during the hospital encounter of 04/28/16 (from the past 48 hour(s))  CBC with Differential/Platelet     Status: Abnormal   Collection Time: 05/01/16  4:42 AM  Result Value Ref Range   WBC 6.9 4.0 - 10.5 K/uL   RBC 2.23 (L) 4.22 - 5.81 MIL/uL   Hemoglobin 7.3 (L) 13.0 - 17.0 g/dL   HCT 21.4 (L) 39.0 - 52.0 %   MCV 96.0 78.0 - 100.0 fL   MCH 32.7 26.0 - 34.0 pg   MCHC 34.1 30.0 - 36.0 g/dL   RDW 18.3 (H) 11.5 - 15.5 %   Platelets 106 (L) 150 - 400 K/uL    Comment: REPEATED TO VERIFY CONSISTENT WITH PREVIOUS RESULT    Neutrophils Relative % 78 %   Neutro Abs 5.4 1.7 - 7.7 K/uL    Lymphocytes Relative 15 %   Lymphs Abs 1.0 0.7 - 4.0 K/uL   Monocytes Relative 7 %   Monocytes Absolute 0.5 0.1 - 1.0 K/uL   Eosinophils Relative 1 %   Eosinophils Absolute 0.1 0.0 - 0.7 K/uL   Basophils Relative 0 %   Basophils Absolute 0.0 0.0 - 0.1 K/uL  Comprehensive metabolic panel     Status: Abnormal   Collection Time: 05/01/16  4:42 AM  Result Value Ref Range   Sodium 133 (L) 135 - 145 mmol/L   Potassium 4.7 3.5 - 5.1 mmol/L   Chloride 102 101 - 111 mmol/L   CO2 24 22 - 32 mmol/L  Glucose, Bld 104 (H) 65 - 99 mg/dL   BUN 20 6 - 20 mg/dL   Creatinine, Ser 1.98 (H) 0.61 - 1.24 mg/dL   Calcium 8.6 (L) 8.9 - 10.3 mg/dL   Total Protein 5.4 (L) 6.5 - 8.1 g/dL   Albumin 2.7 (L) 3.5 - 5.0 g/dL   AST 176 (H) 15 - 41 U/L   ALT 93 (H) 17 - 63 U/L   Alkaline Phosphatase 105 38 - 126 U/L   Total Bilirubin 3.8 (H) 0.3 - 1.2 mg/dL   GFR calc non Af Amer 29 (L) >60 mL/min   GFR calc Af Amer 34 (L) >60 mL/min    Comment: (NOTE) The eGFR has been calculated using the CKD EPI equation. This calculation has not been validated in all clinical situations. eGFR's persistently <60 mL/min signify possible Chronic Kidney Disease.    Anion gap 7 5 - 15  Magnesium     Status: None   Collection Time: 05/01/16  4:42 AM  Result Value Ref Range   Magnesium 1.7 1.7 - 2.4 mg/dL  Phosphorus     Status: None   Collection Time: 05/01/16  4:42 AM  Result Value Ref Range   Phosphorus 2.6 2.5 - 4.6 mg/dL  T4, free     Status: Abnormal   Collection Time: 05/01/16  4:42 AM  Result Value Ref Range   Free T4 1.17 (H) 0.61 - 1.12 ng/dL    Comment: (NOTE) Biotin ingestion may interfere with free T4 tests. If the results are inconsistent with the TSH level, previous test results, or the clinical presentation, then consider biotin interference. If needed, order repeat testing after stopping biotin.   Protime-INR     Status: Abnormal   Collection Time: 05/01/16 10:46 AM  Result Value Ref Range    Prothrombin Time 20.2 (H) 11.4 - 15.2 seconds   INR 1.70   Ammonia     Status: Abnormal   Collection Time: 05/01/16 10:46 AM  Result Value Ref Range   Ammonia 74 (H) 9 - 35 umol/L  Occult blood card to lab, stool     Status: Abnormal   Collection Time: 05/01/16  3:55 PM  Result Value Ref Range   Fecal Occult Bld POSITIVE (A) NEGATIVE  CBC with Differential/Platelet     Status: Abnormal   Collection Time: 05/02/16  6:22 AM  Result Value Ref Range   WBC 13.9 (H) 4.0 - 10.5 K/uL   RBC 2.32 (L) 4.22 - 5.81 MIL/uL   Hemoglobin 7.6 (L) 13.0 - 17.0 g/dL   HCT 23.3 (L) 39.0 - 52.0 %   MCV 100.4 (H) 78.0 - 100.0 fL   MCH 32.8 26.0 - 34.0 pg   MCHC 32.6 30.0 - 36.0 g/dL   RDW 19.1 (H) 11.5 - 15.5 %   Platelets 148 (L) 150 - 400 K/uL   Neutrophils Relative % 83 %   Neutro Abs 11.6 (H) 1.7 - 7.7 K/uL   Lymphocytes Relative 11 %   Lymphs Abs 1.5 0.7 - 4.0 K/uL   Monocytes Relative 6 %   Monocytes Absolute 0.8 0.1 - 1.0 K/uL   Eosinophils Relative 0 %   Eosinophils Absolute 0.0 0.0 - 0.7 K/uL   Basophils Relative 0 %   Basophils Absolute 0.0 0.0 - 0.1 K/uL  Comprehensive metabolic panel     Status: Abnormal   Collection Time: 05/02/16  6:22 AM  Result Value Ref Range   Sodium 138 135 - 145 mmol/L   Potassium 5.2 (  H) 3.5 - 5.1 mmol/L   Chloride 106 101 - 111 mmol/L   CO2 21 (L) 22 - 32 mmol/L   Glucose, Bld 121 (H) 65 - 99 mg/dL   BUN 20 6 - 20 mg/dL   Creatinine, Ser 2.02 (H) 0.61 - 1.24 mg/dL   Calcium 8.9 8.9 - 10.3 mg/dL   Total Protein 5.7 (L) 6.5 - 8.1 g/dL   Albumin 2.9 (L) 3.5 - 5.0 g/dL   AST 161 (H) 15 - 41 U/L   ALT 97 (H) 17 - 63 U/L   Alkaline Phosphatase 105 38 - 126 U/L   Total Bilirubin 3.8 (H) 0.3 - 1.2 mg/dL   GFR calc non Af Amer 29 (L) >60 mL/min   GFR calc Af Amer 33 (L) >60 mL/min    Comment: (NOTE) The eGFR has been calculated using the CKD EPI equation. This calculation has not been validated in all clinical situations. eGFR's persistently <60 mL/min  signify possible Chronic Kidney Disease.    Anion gap 11 5 - 15  Magnesium     Status: None   Collection Time: 05/02/16  6:22 AM  Result Value Ref Range   Magnesium 1.7 1.7 - 2.4 mg/dL  Phosphorus     Status: None   Collection Time: 05/02/16  6:22 AM  Result Value Ref Range   Phosphorus 2.9 2.5 - 4.6 mg/dL  Urinalysis, Routine w reflex microscopic (not at [facility])     Status: Abnormal   Collection Time: 05/02/16  7:52 AM  Result Value Ref Range   Color, Urine ORANGE (A) YELLOW    Comment: BIOCHEMICALS MAY BE AFFECTED BY COLOR   APPearance CLOUDY (A) CLEAR   Specific Gravity, Urine 1.014 1.005 - 1.030   pH 7.5 5.0 - 8.0   Glucose, UA NEGATIVE NEGATIVE mg/dL   Hgb urine dipstick LARGE (A) NEGATIVE   Bilirubin Urine MODERATE (A) NEGATIVE   Ketones, ur 15 (A) NEGATIVE mg/dL   Protein, ur 30 (A) NEGATIVE mg/dL   Nitrite NEGATIVE NEGATIVE   Leukocytes, UA TRACE (A) NEGATIVE  Urine microscopic-add on     Status: Abnormal   Collection Time: 05/02/16  7:52 AM  Result Value Ref Range   Squamous Epithelial / LPF 0-5 (A) NONE SEEN   WBC, UA 6-30 0 - 5 WBC/hpf   RBC / HPF TOO NUMEROUS TO COUNT 0 - 5 RBC/hpf   Bacteria, UA RARE (A) NONE SEEN   Sperm, UA PRESENT    Urine-Other YEAST PRESENT   Culture, blood (Routine X 2) w Reflex to ID Panel     Status: None (Preliminary result)   Collection Time: 05/02/16  8:55 AM  Result Value Ref Range   Specimen Description BLOOD LEFT ANTECUBITAL    Special Requests IN PEDIATRIC BOTTLE 1CC    Culture PENDING    Report Status PENDING   Occult blood card to lab, stool     Status: Abnormal   Collection Time: 05/02/16  2:12 PM  Result Value Ref Range   Fecal Occult Bld POSITIVE (A) NEGATIVE   Ct Abdomen Pelvis Wo Contrast  Result Date: 05/02/2016 CLINICAL DATA:  Elevated white blood cell count.  Low hemoglobin. EXAM: CT ABDOMEN AND PELVIS WITHOUT CONTRAST TECHNIQUE: Multidetector CT imaging of the abdomen and pelvis was performed following the  standard protocol without IV contrast. COMPARISON:  04/28/2016 FINDINGS: Lower chest: Small right pleural effusion, new since prior study. Bibasilar atelectasis. Heart is enlarged. Prior CABG. Hepatobiliary: Diffuse fatty infiltration of the liver. Low-density  area again noted anteriorly within the left lobe, unchanged. No other focal abnormalities visualized. No biliary ductal dilatation. Gallbladder contracted. Mild haziness in the pericholecystic fat again noted, stable. No visible stones. Pancreas: No focal abnormality or ductal dilatation. Spleen: No focal abnormality.  Normal size. Adrenals/Urinary Tract: Continued bladder wall thickening and trabeculation noted, likely related to chronic bladder outlet obstruction. No hydronephrosis. No focal renal abnormality visualized. Adrenal glands unremarkable. Stomach/Bowel: Stomach, large and small bowel grossly unremarkable. Vascular/Lymphatic: Diffuse aortic and iliac calcifications. No aneurysm. Mildly prominent retrocrural and posterior mediastinal lymph nodes adjacent to the esophagus. Index retrocrural lymph node measures 18 mm in short axis diameter compared with 12 mm previously. No other retroperitoneal or mesenteric adenopathy. Reproductive: Marked enlargement of the prostate. Other: No free fluid or free air. Musculoskeletal: No acute bony abnormality or focal bone lesion. Degenerative changes in the lumbar spine. IMPRESSION: Small right pleural effusion, new since prior study. Bibasilar atelectasis. Diffuse fatty infiltration of the liver. Continued ill-defined low-density area within the liver in the left hepatic lobe, which cannot be characterized without intravenous contrast. May consider further evaluation with MRI. Continued mildly contracted gallbladder with stranding in the pericholecystic fat. If there is concern for cholecystitis, this would be better evaluated with ultrasound. Prostate enlargement. Bladder wall thickening likely related to  chronic bladder outlet obstruction. Aortoiliac atherosclerosis. Electronically Signed   By: Rolm Baptise M.D.   On: 05/02/2016 12:13   Dg Chest 2 View  Result Date: 05/02/2016 CLINICAL DATA:  Shortness breath.  CABG. EXAM: CHEST  2 VIEW COMPARISON:  CT the chest 12/11/2015 FINDINGS: Heart is enlarged. Median sternotomy for CABG is again noted. Moderate pulmonary vascular congestion is evident. Bilateral pleural effusions and lower lobe airspace disease are noted. The visualized soft tissues and bony thorax are unremarkable. IMPRESSION: 1. Cardiomegaly with moderate pulmonary vascular congestion bilateral pleural effusions compatible with congestive heart failure. 2. Mild bibasilar airspace disease likely reflects atelectasis. Infection is not excluded. Electronically Signed   By: San Morelle M.D.   On: 05/02/2016 12:19   Mr Abdomen Wo Contrast  Result Date: 05/01/2016 CLINICAL DATA:  Indeterminate hepatic lesion on ultrasound. EXAM: MRI ABDOMEN WITHOUT CONTRAST TECHNIQUE: Multiplanar multisequence MR imaging was performed without the administration of intravenous contrast. COMPARISON:  Ultrasound 04/29/2016, CT 04/28/2016 FINDINGS: Lower chest:  Lung bases are clear. Hepatobiliary: Loss of signal intensity on opposed phase imaging (series 8) consistent hepatic steatosis. Within the central LEFT hepatic lobe 1.6 cm well-circumscribed lesion bilobed lesion is hyperintense on T2 weighted imaging (image 34 series 4. No IV contrast was administered to evaluate enhancement characteristics. No biliary duct dilatation. Gallbladder has mild wall thickening without distention. Mild pericholecystic fluid. No gallstones evident. Common bile duct normal caliber. Pancreas: Normal pancreatic parenchymal intensity. No ductal dilatation or inflammation. Spleen: Normal spleen. Adrenals/urinary tract: Adrenal glands and kidneys are normal. Stomach/Bowel: Stomach is normal. There is some fluid along the second  portion duodenum adjacent the gallbladder fossa. (Image 26, series 5 Vascular/Lymphatic: Abdominal aortic normal caliber. No retroperitoneal periportal lymphadenopathy. Mild venous collaterals in the retrocrural space Musculoskeletal: No aggressive osseous lesion IMPRESSION: 1. While no IV contrast administered, lesion in the central LEFT hepatic lobe has imaging characteristics most consistent with benign cyst. 2. Hepatic steatosis.  No biliary obstruction. 3. Small amount of fluid along the second portion duodenum and mild pericholecystic fluid. Differential would include duodenitis, pancreatitis, or chronic cholecystitis. Pancreas does not appear inflamed. Electronically Signed   By: Suzy Bouchard M.D.   On: 05/01/2016  10:03   Assessment/Plan Leukocytosis - 13.9 Gallbladder contraction and fat stranding on CT  - initial RUQ U/S and CT negative for gallstones, GB wall thickening, or dilated CBD - AST/ALT/t bili elevated, attributable to liver disease - lipase pending - repeat RUQ U/S pending - benign abdominal exam   Anemia of chronic blood loss - EGD negative. GI recommending outpatient colonoscopy once medically stable. Alcoholic liver disease  Coagulopathy Thrombocytopenia EtOH abuse - detox per [facility]WA protocol  afib - anticoagulation held Hypothyroidism Hyponatremia COPD  Plan: follow results RUQ U/S and lipase. Low suspicion for acute cholecystitis. If RUQ U/S positive for acute cholecystitis will order HIDA scan to rule out.    Jill Alexanders, [facility] 05/02/2016, 4:06 PM Pager: [phone] Consults: [phone] Mon-Fri 7:00 am-4:30 pm Sat-Sun 7:00 am-11:30 am

## 2016-05-03 ENCOUNTER — Inpatient Hospital Stay (HOSPITAL_COMMUNITY): Payer: Medicare Other

## 2016-05-03 DIAGNOSIS — R1013 Epigastric pain: Secondary | ICD-10-CM

## 2016-05-03 LAB — CBC WITH DIFFERENTIAL/PLATELET
BASOS ABS: 0 10*3/uL (ref 0.0–0.1)
BLASTS: 0 %
Band Neutrophils: 0 %
Basophils Relative: 0 %
Eosinophils Absolute: 0.1 10*3/uL (ref 0.0–0.7)
Eosinophils Relative: 1 %
HEMATOCRIT: 23.2 % — AB (ref 39.0–52.0)
HEMOGLOBIN: 7.6 g/dL — AB (ref 13.0–17.0)
LYMPHS ABS: 1.6 10*3/uL (ref 0.7–4.0)
Lymphocytes Relative: 13 %
MCH: 32.8 pg (ref 26.0–34.0)
MCHC: 32.8 g/dL (ref 30.0–36.0)
MCV: 100 fL (ref 78.0–100.0)
METAMYELOCYTES PCT: 0 %
MYELOCYTES: 0 %
Monocytes Absolute: 0.9 10*3/uL (ref 0.1–1.0)
Monocytes Relative: 7 %
NEUTROS PCT: 79 %
Neutro Abs: 9.7 10*3/uL — ABNORMAL HIGH (ref 1.7–7.7)
Other: 0 %
PROMYELOCYTES ABS: 0 %
Platelets: 152 10*3/uL (ref 150–400)
RBC: 2.32 MIL/uL — AB (ref 4.22–5.81)
RDW: 19 % — ABNORMAL HIGH (ref 11.5–15.5)
WBC: 12.3 10*3/uL — AB (ref 4.0–10.5)
nRBC: 0 /100 WBC

## 2016-05-03 LAB — COMPREHENSIVE METABOLIC PANEL
ALK PHOS: 104 U/L (ref 38–126)
ALT: 90 U/L — AB (ref 17–63)
AST: 135 U/L — ABNORMAL HIGH (ref 15–41)
Albumin: 2.6 g/dL — ABNORMAL LOW (ref 3.5–5.0)
Anion gap: 9 (ref 5–15)
BILIRUBIN TOTAL: 3.5 mg/dL — AB (ref 0.3–1.2)
BUN: 23 mg/dL — ABNORMAL HIGH (ref 6–20)
CALCIUM: 8.9 mg/dL (ref 8.9–10.3)
CO2: 22 mmol/L (ref 22–32)
CREATININE: 2.22 mg/dL — AB (ref 0.61–1.24)
Chloride: 108 mmol/L (ref 101–111)
GFR calc non Af Amer: 26 mL/min — ABNORMAL LOW (ref >60)
GFR, EST AFRICAN AMERICAN: 30 mL/min — AB (ref >60)
GLUCOSE: 110 mg/dL — AB (ref 65–99)
Potassium: 4.2 mmol/L (ref 3.5–5.1)
SODIUM: 139 mmol/L (ref 135–145)
Total Protein: 5.6 g/dL — ABNORMAL LOW (ref 6.5–8.1)

## 2016-05-03 LAB — PHOSPHORUS: PHOSPHORUS: 3.4 mg/dL (ref 2.5–4.6)

## 2016-05-03 LAB — TROPONIN I: Troponin I: 0.04 ng/mL (ref <0.03)

## 2016-05-03 LAB — MAGNESIUM: Magnesium: 1.7 mg/dL (ref 1.7–2.4)

## 2016-05-03 MED ORDER — GI COCKTAIL ~~LOC~~
30.0000 mL | Freq: Once | ORAL | Status: AC
  Administered 2016-05-03: 30 mL via ORAL
  Filled 2016-05-03: qty 30

## 2016-05-03 MED ORDER — SODIUM CHLORIDE 0.9 % IV SOLN: INTRAVENOUS | Status: DC

## 2016-05-03 NOTE — Progress Notes (Signed)
PROGRESS NOTE    Xavier Taylor  ZOX:[id number] DOB: 19-Sep-1929 DOA: 04/28/2016 PCP: Ginette Otto, MD  Brief Narrative: Xavier Taylor is an 82 y.o. male with a PMH of CAD status post CABG, chronic atrial fibrillation on blood thinners and Heavily Alcohol Abuse who was admitted 04/28/16 with a chief complaint of confusion and weakness. He underwent an EGD 04/30/2016. He is now FOBT Positive. Patient spiked a white count and had imaging showing contracted gallbladder so General Surgery was consulted. Imaging revealed no Acute Cholecystitis, however patient did have a Urine Culture come back with E. Faecalis. Patient complained of midepigastric pain later in the day so an EKG was done and troponins were drawn and pending.   Assessment & Plan:   Principal Problem:   Alcoholic liver disease (HCC) Active Problems:   CAD (coronary artery disease)   Atrial fibrillation, chronic (HCC)   Anemia   Hyponatremia   Hypokalemia   Hypomagnesemia   COPD (chronic obstructive pulmonary disease) (HCC)   Alcohol abuse   Acute liver failure   Coagulopathy (HCC)   Thrombocytopenia (HCC)   Hypothyroidism   Acute renal failure superimposed on stage 3 chronic kidney disease (HCC)  Leukocytosis likely 2/2 to Enterococcus Faecalis UTI -WBC went from 6 -> 13.9 -> 12.3; patient had mild temperature -Blood Cx Negative x 1 day.  -Urine Cx showed >100,000 of E Faecalis -CXR ordered and showed Cardiomegaly with moderate pulmonary vascular congestion bilateral pleural effusions compatible with congestive heart failure. Mild bibasilar airspace disease likely reflects atelectasis. Infection is not excluded. -CT of Abdomen and Pelvis showed Small right pleural effusion, new since prior study. Bibasilar atelectasis. Diffuse fatty infiltration of the liver. Continued ill-defined low-density area within the liver in the left hepatic lobe, which cannot be characterized without intravenous contrast. May  consider further evaluation with MRI.  Continued mildly contracted gallbladder with stranding in the pericholecystic fat.  -Ordered U/S of GallBladder which did not reveal Acute Cholecystitis -Consulted General Surgery yesterday and appreciated their recc's -Obtain Lipase Level  -LFT's elevated -Started Gentle IVF at 50 ml/hr -C/W  IV Zosyn and Pharmacy to Renally Dose  Alcoholic liver disease (HCC)/Acute liver failure with confusion and weakness -The patient's elevated LFTs, low protein/albumin, thrombocytopenia and elevated ammonia levels (now 74) suggest fairly advanced alcohol-induced liver diseases -CT of the abdomen showed fatty infiltration of the liver. Ill-defined hypodensity in the right lobe of the liver noted, neoplastic process could not be excluded.  -Ultrasound showed fatty liver and a complex cyst in the right hepatic lobe measuring 1.9 cm with recommendations for MRI for further correlation.  -MRI of Abdomen with and without Contrast ordered to Evaluate Hepatic Lobe Lesion and showed lesion in the central LEFT hepatic lobe has imaging characteristics most consistent with benign cyst. Hepatic steatosis.  No biliary obstruction.  Small amount of fluid along the second portion duodenum and mild pericholecystic fluid. Differential would include duodenitis, pancreatitis, or chronic cholecystitis. Pancreas does not appear inflamed. -GI consulted to assist with further workup/management. Appreciate Additional Recc's -Talked with Dr. Dulce Sellar yesterday and he states he will see the patient in AM  Chest Discomfort -EKG did not change however could have had rate related T wave inversions. Discussed with Cardiology Dr. Garnette Scheuermann -Troponins Pending -GI Cocktail Given -Continue to Monitor and Low Threshold for Cards Eval  Acute on Chronic Renal Failure/stage III Chronic Kidney Disease -Baseline creatinine is around 1.5 with a GFR of around 36.  -BUN/Cr went to 23/2.22 with estimated GFR  of 33 -Will continue to Follow creatinine.   Coagulopathy/thrombocytopenia -Platelet Count went from 112 -> 106 -> 148 -> 152 -Patient's INR was 1.70 yesterday -Patient lost blood from a skin tear on his right elbow, and he likely has GI bleeding as well. -Was given 2 units of fresh frozen plasma and a dose of vitamin K.  CAD (coronary artery disease)  -Given reports of dark stools, and markedly low hemoglobin, avoid aspirin.  -Had midepigastric chest discomfort -Statins on hold secondary to elevated LFTs.  Atrial fibrillation, chronic (HCC) -Blood thinners discontinued by admitting physician given elevated INR in the setting of alcohol abuse and high fall risk. -Concern for GI Bleeding so held.   Anemia of chronic blood loss -S/p Transfusion of 1 unit of pRBC -Hb/Hct was 7.6/23.2 Today -Initial FOBT Negative however family reports of dark stools with blood. Repeat FOBT was Positive x2 now -EGD done showed There is no endoscopic evidence of Barrett's esophagus, esophagitis, inflammation, stricture, ulcerations or varices in the entire esophagus. The stomach was normal. The examined duodenum was normal and there was no etiology evident for anemia on Endoscopic Evaluation -Continue to Monitor H/H -Will likely need Colonoscopy. Will discuss with Dr. Dulce Sellarutlaw  Hypothyroidism -TSH markedly elevated at 19.74.; Free T4 1.17 -On Synthroid 50 g daily.  -Suspect non-compliance.  Hyponatremia, improved -Patient's Sodium was 133 -> 138 -Possible from [employer]. -Repeat BMP in AM  COPD (chronic obstructive pulmonary disease) with possible Volume Overload -Stopped IVF and Gave Dose of Lasix.  -Controlled. Currently not in Exacerbation -C/w Albuterol Nebs -Scheduled DuoNEbs TID -Flutter Valve as patient was slightly wheezing  Alcohol abuse -Continue alcohol detox per CIWA protocol. -C/w MVI, Thiamine, and Folic Acid  DVT prophylaxis: SCDs as patient underwent EGD  Code  Status: DNR Family Communication: Discussed case with wife, sons and daughter at bedside Disposition Plan: SNF when stable for D/C  Consultants:   Gastroenterology  General Surgery  Procedures: EGD  Antimicrobials: IV Zosyn  Subjective: Seen and examined and he was resting well earlier however later in the day he became Nauseous and vomited. Stated that he had some epigastric discomfort later in the day. Had no other complaints or concerns at this time.   Objective: Vitals:   05/03/16 0453 05/03/16 0733 05/03/16 1403 05/03/16 1424  BP: (!) 144/79   140/69  Pulse: 88   (!) 41  Resp: 18   17  Temp: 98.4 F (36.9 C)   98.6 F (37 C)  TempSrc:      SpO2: 95% 95% 97% 100%  Weight:      Height:        Intake/Output Summary (Last 24 hours) at 05/03/16 1929 Last data filed at 05/03/16 1628  Gross per 24 hour  Intake              200 ml  Output              550 ml  Net             -350 ml   Filed Weights   04/28/16 1636 04/28/16 2334 05/02/16 0524  Weight: 74.8 kg (165 lb) 70.7 kg (155 lb 14.4 oz) 70.5 kg (155 lb 6.8 oz)    Examination: Physical Exam:  Constitutional: WN/WD, Mild respiratory Distress Eyes: Slight icteric scleara.  ENMT: External Ears, Nose appear normal. Grossly normal hearing. Mucous membranes slightly dry. Poor dentition.  Neck: Appears normal, supple, no cervical masses, normal ROM, no appreciable thyromegaly, no JVD Respiratory:  Increased respiratory effort with mild wheezing, No rales, rhonchi or crackles. Mild accessory muscle use.  Cardiovascular: RRR, no murmurs / rubs / gallops. S1 and S2 auscultated. No extremity edema. 2+ pedal pulses.  Abdomen: Soft, mid-epigastric tenderness, non-distended. No masses palpated. No appreciable hepatosplenomegaly. Bowel sounds positive x4.  GU: Deferred. Condom Cath in place with Dark Urine in foley bag.  Musculoskeletal: No clubbing / cyanosis of digits/nails. No joint deformity upper and lower extremities.  No contractures. Normal strength and muscle tone.  Skin: No rashes, lesions, ulcers. No induration; Warm and dry.  Neurologic: CN 2-12 grossly intact with no focal deficits. Sensation intact in all 4 Extremities. Strength 5/5 in all 4. Romberg sign cerebellar reflexes not assessed.  Psychiatric: Normal judgment and insight. Alert and oriented x 3. Normal mood and flat affect.   Data Reviewed: I have personally reviewed following labs and imaging studies  CBC:  Recent Labs Lab 04/28/16 1647  04/29/16 0340 04/29/16 1211 04/30/16 0730 05/01/16 0442 05/02/16 0622 05/03/16 0619  WBC 7.5  --  6.8  --  6.4 6.9 13.9* 12.3*  NEUTROABS 5.8  --   --   --  5.0 5.4 11.6* 9.7*  HGB 7.1*  < > 6.4* 7.8* 7.2* 7.3* 7.6* 7.6*  HCT 19.8*  < > 17.9* 22.1* 20.4* 21.4* 23.3* 23.2*  MCV 96.6  --  95.7  --  93.6 96.0 100.4* 100.0  PLT 115*  --  103*  --  112* 106* 148* 152  < > = values in this interval not displayed. Basic Metabolic Panel:  Recent Labs Lab 04/28/16 1647  04/28/16 2355 04/29/16 0340 04/30/16 0730 05/01/16 0442 05/02/16 0622 05/03/16 0619  NA 128*  < >  --  124* 132* 133* 138 139  K 3.3*  < >  --  3.2* 4.0 4.7 5.2* 4.2  CL 89*  < >  --  88* 96* 102 106 108  CO2 27  --   --  23 25 24  21* 22  GLUCOSE 122*  < >  --  100* 98 104* 121* 110*  BUN 20  < >  --  23* 22* 20 20 23*  CREATININE 2.81*  < >  --  2.72* 2.22* 1.98* 2.02* 2.22*  CALCIUM 8.3*  --   --  8.0* 8.6* 8.6* 8.9 8.9  MG 1.3*  --   --  2.0  --  1.7 1.7 1.7  PHOS  --   --  3.4 3.2  --  2.6 2.9 3.4  < > = values in this interval not displayed. GFR: Estimated Creatinine Clearance: 24 mL/min (by C-G formula based on SCr of 2.22 mg/dL (H)). Liver Function Tests:  Recent Labs Lab 04/29/16 0340 04/30/16 0730 05/01/16 0442 05/02/16 0622 05/03/16 0619  AST 142* 151* 176* 161* 135*  ALT 81* 83* 93* 97* 90*  ALKPHOS 120 109 105 105 104  BILITOT 4.5* 4.2* 3.8* 3.8* 3.5*  PROT 5.2* 5.6* 5.4* 5.7* 5.6*  ALBUMIN 2.6*  2.7* 2.7* 2.9* 2.6*   No results for input(s): LIPASE, AMYLASE in the last 168 hours.  Recent Labs Lab 04/28/16 1647 04/29/16 0340 05/01/16 1046  AMMONIA 48* 59* 74*   Coagulation Profile:  Recent Labs Lab 04/28/16 1647 04/29/16 0340 04/30/16 0730 05/01/16 1046  INR 2.90 2.90 1.72 1.70   Cardiac Enzymes: No results for input(s): CKTOTAL, CKMB, CKMBINDEX, TROPONINI in the last 168 hours. BNP (last 3 results)  Recent Labs  01/23/16 1521  PROBNP 650.0*  HbA1C: No results for input(s): HGBA1C in the last 72 hours. CBG: No results for input(s): GLUCAP in the last 168 hours. Lipid Profile: No results for input(s): CHOL, HDL, LDLCALC, TRIG, CHOLHDL, LDLDIRECT in the last 72 hours. Thyroid Function Tests:  Recent Labs  05/01/16 0442  FREET4 1.17*   Anemia Panel: No results for input(s): VITAMINB12, FOLATE, FERRITIN, TIBC, IRON, RETICCTPCT in the last 72 hours. Sepsis Labs: No results for input(s): PROCALCITON, LATICACIDVEN in the last 168 hours.  Recent Results (from the past 240 hour(s))  Culture, Urine     Status: Abnormal (Preliminary result)   Collection Time: 05/02/16  7:52 AM  Result Value Ref Range Status   Specimen Description URINE, RANDOM  Final   Special Requests NONE  Final   Culture >=100,000 COLONIES/mL ENTEROCOCCUS FAECALIS (A)  Final   Report Status PENDING  Incomplete  Culture, blood (Routine X 2) w Reflex to ID Panel     Status: None (Preliminary result)   Collection Time: 05/02/16  8:47 AM  Result Value Ref Range Status   Specimen Description BLOOD LEFT ARM  Final   Special Requests IN PEDIATRIC BOTTLE 1CC  Final   Culture NO GROWTH 1 DAY  Final   Report Status PENDING  Incomplete  Culture, blood (Routine X 2) w Reflex to ID Panel     Status: None (Preliminary result)   Collection Time: 05/02/16  8:55 AM  Result Value Ref Range Status   Specimen Description BLOOD LEFT ANTECUBITAL  Final   Special Requests IN PEDIATRIC BOTTLE 1CC  Final    Culture NO GROWTH 1 DAY  Final   Report Status PENDING  Incomplete     Radiology Studies: Ct Abdomen Pelvis Wo Contrast  Result Date: 05/02/2016 CLINICAL DATA:  Elevated white blood cell count.  Low hemoglobin. EXAM: CT ABDOMEN AND PELVIS WITHOUT CONTRAST TECHNIQUE: Multidetector CT imaging of the abdomen and pelvis was performed following the standard protocol without IV contrast. COMPARISON:  04/28/2016 FINDINGS: Lower chest: Small right pleural effusion, new since prior study. Bibasilar atelectasis. Heart is enlarged. Prior CABG. Hepatobiliary: Diffuse fatty infiltration of the liver. Low-density area again noted anteriorly within the left lobe, unchanged. No other focal abnormalities visualized. No biliary ductal dilatation. Gallbladder contracted. Mild haziness in the pericholecystic fat again noted, stable. No visible stones. Pancreas: No focal abnormality or ductal dilatation. Spleen: No focal abnormality.  Normal size. Adrenals/Urinary Tract: Continued bladder wall thickening and trabeculation noted, likely related to chronic bladder outlet obstruction. No hydronephrosis. No focal renal abnormality visualized. Adrenal glands unremarkable. Stomach/Bowel: Stomach, large and small bowel grossly unremarkable. Vascular/Lymphatic: Diffuse aortic and iliac calcifications. No aneurysm. Mildly prominent retrocrural and posterior mediastinal lymph nodes adjacent to the esophagus. Index retrocrural lymph node measures 18 mm in short axis diameter compared with 12 mm previously. No other retroperitoneal or mesenteric adenopathy. Reproductive: Marked enlargement of the prostate. Other: No free fluid or free air. Musculoskeletal: No acute bony abnormality or focal bone lesion. Degenerative changes in the lumbar spine. IMPRESSION: Small right pleural effusion, new since prior study. Bibasilar atelectasis. Diffuse fatty infiltration of the liver. Continued ill-defined low-density area within the liver in the left  hepatic lobe, which cannot be characterized without intravenous contrast. May consider further evaluation with MRI. Continued mildly contracted gallbladder with stranding in the pericholecystic fat. If there is concern for cholecystitis, this would be better evaluated with ultrasound. Prostate enlargement. Bladder wall thickening likely related to chronic bladder outlet obstruction. Aortoiliac atherosclerosis. Electronically Signed   By:  Charlett NoseKevin  Dover M.D.   On: 05/02/2016 12:13   Dg Chest 2 View  Result Date: 05/02/2016 CLINICAL DATA:  Shortness breath.  CABG. EXAM: CHEST  2 VIEW COMPARISON:  CT the chest 12/11/2015 FINDINGS: Heart is enlarged. Median sternotomy for CABG is again noted. Moderate pulmonary vascular congestion is evident. Bilateral pleural effusions and lower lobe airspace disease are noted. The visualized soft tissues and bony thorax are unremarkable. IMPRESSION: 1. Cardiomegaly with moderate pulmonary vascular congestion bilateral pleural effusions compatible with congestive heart failure. 2. Mild bibasilar airspace disease likely reflects atelectasis. Infection is not excluded. Electronically Signed   By: Marin Robertshristopher  Mattern M.D.   On: 05/02/2016 12:19   [place] Abdomen Complete  Result Date: 05/03/2016 CLINICAL DATA:  Contracted gallbladder on CT, possible cholecystitis EXAM: ABDOMEN ULTRASOUND COMPLETE COMPARISON:  CT abdomen/pelvis dated 05/02/2016. MRI abdomen dated 05/01/2016. Right upper quadrant ultrasound dated 04/29/2016. CT abdomen/pelvis dated 04/28/2016. FINDINGS: Gallbladder: Mild gallbladder wall thickening. No gallstones, pericholecystic fluid, or sonographic Murphy's sign. Common bile duct: Diameter: 6 mm Liver: Hyperechoic hepatic parenchyma with coarse echotexture. Predominantly hypoechoic lesion with increased through transmission in the central liver (image 17), measuring 1.9 x 1.5 x 1.8 cm. IVC: No abnormality visualized. Pancreas: Visualized portion unremarkable. Spleen:  Size and appearance within normal limits. Right Kidney: Length: 11.3 cm.  No mass or hydronephrosis. Left Kidney: Length: 10.7 cm. 2.3 x 2.6 x 2.2 cm upper pole renal cyst. No hydronephrosis. Abdominal aorta: No aneurysm visualized. Other findings: None. IMPRESSION: Mild gallbladder wall thickening, without associated sonographic findings to suggest acute cholecystitis. 1.9 cm lesion in the central liver, better characterized as a cyst on MRI. Suspected hepatic steatosis. 2.6 cm left upper pole renal cyst. Electronically Signed   By: Charline BillsSriyesh  Krishnan M.D.   On: 05/03/2016 11:24   Scheduled Meds: . carvedilol  12.5 mg Oral BID WC  . feeding supplement  1 Container Oral TID BM  . folic acid  1 mg Oral Daily  . ipratropium-albuterol  3 mL Nebulization TID  . levothyroxine  50 mcg Oral QAC breakfast  . mouth rinse  15 mL Mouth Rinse BID  . multivitamin with minerals  1 tablet Oral Daily  . piperacillin-tazobactam (ZOSYN)  IV  3.375 g Intravenous Q8H  . pramipexole  0.25 mg Oral QHS  . tamsulosin  0.4 mg Oral Daily  . thiamine  100 mg Oral Daily   Continuous Infusions:    LOS: 5 days   Merlene Laughtermair Latif Tyrome Donatelli, DO [facility] Pager [phone]  If 7PM-7AM, please contact night-coverage [email or web address] Password [email or web address] 05/03/2016, 7:29 PM

## 2016-05-03 NOTE — Progress Notes (Signed)
[facility] Progress Note  3 Days Post-Op  Subjective: Awake and alert. Denies abdominal pain, nausea, vomiting.   Objective: Vital signs in last 24 hours: Temp:  [98.4 F (36.9 C)-98.9 F (37.2 C)] 98.4 F (36.9 C) (10/28 0453) Pulse Rate:  [88-100] 88 (10/28 0453) Resp:  [18-20] 18 (10/28 0453) BP: (104-144)/(55-86) 144/79 (10/28 0453) SpO2:  [95 %-98 %] 95 % (10/28 0733) Last BM Date: 05/01/16  Intake/Output from previous day: 10/27 0701 - 10/28 0700 In: 748.8 [P.O.:120; I.V.:528.8; IV Piggyback:100] Out: 550 [Urine:550] Intake/Output this shift: Total I/O In: 50 [P.O.:50] Out: -   PE: Gen:  Alert, NAD, pleasant, Abd: Soft, NT/ND, +BS  Lab Results:   Recent Labs  05/02/16 0622 05/03/16 0619  WBC 13.9* 12.3*  HGB 7.6* 7.6*  HCT 23.3* 23.2*  PLT 148* 152   BMET  Recent Labs  05/02/16 0622 05/03/16 0619  NA 138 139  K 5.2* 4.2  CL 106 108  CO2 21* 22  GLUCOSE 121* 110*  BUN 20 23*  CREATININE 2.02* 2.22*  CALCIUM 8.9 8.9   PT/INR  Recent Labs  05/01/16 1046  LABPROT 20.2*  INR 1.70   CMP     Component Value Date/Time   NA 139 05/03/2016 0619   K 4.2 05/03/2016 0619   CL 108 05/03/2016 0619   CO2 22 05/03/2016 0619   GLUCOSE 110 (H) 05/03/2016 0619   BUN 23 (H) 05/03/2016 0619   CREATININE 2.22 (H) 05/03/2016 0619   CALCIUM 8.9 05/03/2016 0619   PROT 5.6 (L) 05/03/2016 0619   ALBUMIN 2.6 (L) 05/03/2016 0619   AST 135 (H) 05/03/2016 0619   ALT 90 (H) 05/03/2016 0619   ALKPHOS 104 05/03/2016 0619   BILITOT 3.5 (H) 05/03/2016 0619   GFRNONAA 26 (L) 05/03/2016 0619   GFRAA 30 (L) 05/03/2016 0619   Studies/Results: Ct Abdomen Pelvis Wo Contrast  Result Date: 05/02/2016 CLINICAL DATA:  Elevated white blood cell count.  Low hemoglobin. EXAM: CT ABDOMEN AND PELVIS WITHOUT CONTRAST TECHNIQUE: Multidetector CT imaging of the abdomen and pelvis was performed following the standard protocol without IV contrast. COMPARISON:   04/28/2016 FINDINGS: Lower chest: Small right pleural effusion, new since prior study. Bibasilar atelectasis. Heart is enlarged. Prior CABG. Hepatobiliary: Diffuse fatty infiltration of the liver. Low-density area again noted anteriorly within the left lobe, unchanged. No other focal abnormalities visualized. No biliary ductal dilatation. Gallbladder contracted. Mild haziness in the pericholecystic fat again noted, stable. No visible stones. Pancreas: No focal abnormality or ductal dilatation. Spleen: No focal abnormality.  Normal size. Adrenals/Urinary Tract: Continued bladder wall thickening and trabeculation noted, likely related to chronic bladder outlet obstruction. No hydronephrosis. No focal renal abnormality visualized. Adrenal glands unremarkable. Stomach/Bowel: Stomach, large and small bowel grossly unremarkable. Vascular/Lymphatic: Diffuse aortic and iliac calcifications. No aneurysm. Mildly prominent retrocrural and posterior mediastinal lymph nodes adjacent to the esophagus. Index retrocrural lymph node measures 18 mm in short axis diameter compared with 12 mm previously. No other retroperitoneal or mesenteric adenopathy. Reproductive: Marked enlargement of the prostate. Other: No free fluid or free air. Musculoskeletal: No acute bony abnormality or focal bone lesion. Degenerative changes in the lumbar spine. IMPRESSION: Small right pleural effusion, new since prior study. Bibasilar atelectasis. Diffuse fatty infiltration of the liver. Continued ill-defined low-density area within the liver in the left hepatic lobe, which cannot be characterized without intravenous contrast. May consider further evaluation with MRI. Continued mildly contracted gallbladder with stranding in the pericholecystic fat. If there is  concern for cholecystitis, this would be better evaluated with ultrasound. Prostate enlargement. Bladder wall thickening likely related to chronic bladder outlet obstruction. Aortoiliac  atherosclerosis. Electronically Signed   By: Charlett NoseKevin  Dover M.D.   On: 05/02/2016 12:13   Dg Chest 2 View  Result Date: 05/02/2016 CLINICAL DATA:  Shortness breath.  CABG. EXAM: CHEST  2 VIEW COMPARISON:  CT the chest 12/11/2015 FINDINGS: Heart is enlarged. Median sternotomy for CABG is again noted. Moderate pulmonary vascular congestion is evident. Bilateral pleural effusions and lower lobe airspace disease are noted. The visualized soft tissues and bony thorax are unremarkable. IMPRESSION: 1. Cardiomegaly with moderate pulmonary vascular congestion bilateral pleural effusions compatible with congestive heart failure. 2. Mild bibasilar airspace disease likely reflects atelectasis. Infection is not excluded. Electronically Signed   By: Marin Robertshristopher  Mattern M.D.   On: 05/02/2016 12:19   Anti-infectives: Anti-infectives    Start     Dose/Rate Route Frequency Ordered Stop   05/02/16 1700  piperacillin-tazobactam (ZOSYN) IVPB 3.375 g     3.375 g 12.5 mL/hr over 240 Minutes Intravenous Every 8 hours 05/02/16 1611       Assessment/Plan Leukocytosis - 13.9 Gallbladder contraction and fat stranding on CT  - initial RUQ U/S and CT negative for gallstones, GB wall thickening, or dilated CBD - AST/ALT/t bili elevated, attributable to liver disease - lipase pending - repeat RUQ U/S pending - benign abdominal exam   Anemia of chronic blood loss - EGD negative. GI recommending outpatient colonoscopy once medically stable. Alcoholic liver disease  Coagulopathy Thrombocytopenia EtOH abuse - detox per CIWA protocol  afib - anticoagulation held Hypothyroidism Hyponatremia COPD  Plan: RUQ U/S negative for acute cholecystitis x2. No abdominal pain. Very low suspicion for acute cholecystitis. No role for HIDA at this time. General surgery will sign off but will be available as needed.    LOS: 5 days    Adam PhenixElizabeth S Dnya Hickle , [facility] 05/03/2016, 11:26 AM Pager:  [phone] Consults: [phone] Mon-Fri 7:00 am-4:30 pm Sat-Sun 7:00 am-11:30 am

## 2016-05-03 NOTE — Clinical Social Work Note (Signed)
Clinical Social Worker continuing to follow patient and family for support and discharge planning needs.  Spoke with MD who states that patient is not medically stable for discharge.  Per MD, patient will likely discharge Monday/Tuesday.  [facility]  CSW spoke with admissions coordinator to provide update on discharge plans.  CSW remains available for support and to facilitate patient discharge needs once medically stable.  Xavier GoldsJesse Laqueshia Cihlar, LCSW (Weekend Coverage) [phone]

## 2016-05-04 DIAGNOSIS — I482 Chronic atrial fibrillation: Secondary | ICD-10-CM

## 2016-05-04 DIAGNOSIS — N390 Urinary tract infection, site not specified: Secondary | ICD-10-CM

## 2016-05-04 DIAGNOSIS — R079 Chest pain, unspecified: Secondary | ICD-10-CM

## 2016-05-04 DIAGNOSIS — K709 Alcoholic liver disease, unspecified: Secondary | ICD-10-CM

## 2016-05-04 LAB — COMPREHENSIVE METABOLIC PANEL
ALK PHOS: 94 U/L (ref 38–126)
ALT: 87 U/L — AB (ref 17–63)
AST: 119 U/L — ABNORMAL HIGH (ref 15–41)
Albumin: 2.5 g/dL — ABNORMAL LOW (ref 3.5–5.0)
Anion gap: 11 (ref 5–15)
BILIRUBIN TOTAL: 3 mg/dL — AB (ref 0.3–1.2)
BUN: 31 mg/dL — ABNORMAL HIGH (ref 6–20)
CALCIUM: 8.8 mg/dL — AB (ref 8.9–10.3)
CO2: 23 mmol/L (ref 22–32)
CREATININE: 2.34 mg/dL — AB (ref 0.61–1.24)
Chloride: 107 mmol/L (ref 101–111)
GFR calc non Af Amer: 24 mL/min — ABNORMAL LOW (ref >60)
GFR, EST AFRICAN AMERICAN: 28 mL/min — AB (ref >60)
GLUCOSE: 126 mg/dL — AB (ref 65–99)
Potassium: 3.7 mmol/L (ref 3.5–5.1)
SODIUM: 141 mmol/L (ref 135–145)
Total Protein: 5.8 g/dL — ABNORMAL LOW (ref 6.5–8.1)

## 2016-05-04 LAB — CBC WITH DIFFERENTIAL/PLATELET
Basophils Absolute: 0 10*3/uL (ref 0.0–0.1)
Basophils Relative: 0 %
EOS ABS: 0 10*3/uL (ref 0.0–0.7)
Eosinophils Relative: 0 %
HEMATOCRIT: 22.7 % — AB (ref 39.0–52.0)
HEMOGLOBIN: 7.5 g/dL — AB (ref 13.0–17.0)
LYMPHS ABS: 1.7 10*3/uL (ref 0.7–4.0)
LYMPHS PCT: 13 %
MCH: 33 pg (ref 26.0–34.0)
MCHC: 33 g/dL (ref 30.0–36.0)
MCV: 100 fL (ref 78.0–100.0)
Monocytes Absolute: 0.8 10*3/uL (ref 0.1–1.0)
Monocytes Relative: 6 %
NEUTROS ABS: 10.2 10*3/uL — AB (ref 1.7–7.7)
NEUTROS PCT: 81 %
Platelets: 172 10*3/uL (ref 150–400)
RBC: 2.27 MIL/uL — AB (ref 4.22–5.81)
RDW: 19.2 % — ABNORMAL HIGH (ref 11.5–15.5)
WBC: 12.7 10*3/uL — AB (ref 4.0–10.5)

## 2016-05-04 LAB — GASTROINTESTINAL PANEL BY PCR, STOOL (REPLACES STOOL CULTURE)

## 2016-05-04 LAB — URINE CULTURE

## 2016-05-04 LAB — TROPONIN I
Troponin I: 0.03 ng/mL (ref <0.03)
Troponin I: 0.05 ng/mL (ref <0.03)

## 2016-05-04 LAB — PHOSPHORUS
PHOSPHORUS: 4.9 mg/dL — AB (ref 2.5–4.6)
Phosphorus: 3.8 mg/dL (ref 2.5–4.6)

## 2016-05-04 LAB — MAGNESIUM: Magnesium: 1.8 mg/dL (ref 1.7–2.4)

## 2016-05-04 MED ORDER — ALUM & MAG HYDROXIDE-SIMETH 200-200-20 MG/5ML PO SUSP
15.0000 mL | ORAL | Status: DC | PRN
  Administered 2016-05-04: 15 mL via ORAL
  Filled 2016-05-04: qty 30

## 2016-05-04 NOTE — Progress Notes (Signed)
PROGRESS NOTE    Kathrin RuddyJackie O Keadle  ZOX:[id number] DOB: 1929-08-14 DOA: 04/28/2016 PCP: Ginette OttoSTONEKING,HAL THOMAS, MD  Brief Narrative: Kathrin RuddyJackie O Klassen is an 82 y.o. male with a PMH of CAD status post CABG, chronic atrial fibrillation on blood thinners and Heavily Alcohol Abuse who was admitted 04/28/16 with a chief complaint of confusion and weakness. He underwent an EGD 04/30/2016. He is now FOBT Positive. Patient spiked a white count and had imaging showing contracted gallbladder so General Surgery was consulted. Imaging revealed no Acute Cholecystitis, however patient did have a Urine Culture come back with E. Faecalis. Patient complained of midepigastric pain later in the day so an EKG was done and troponins were mildly elevated. Cardiology was consulted. After discussion with son Palliative Care was also consulted.   Assessment & Plan:   Principal Problem:   Alcoholic liver disease (HCC) Active Problems:   CAD (coronary artery disease)   Atrial fibrillation, chronic (HCC)   Anemia   Hyponatremia   Hypokalemia   Hypomagnesemia   COPD (chronic obstructive pulmonary disease) (HCC)   Alcohol abuse   Acute liver failure   Coagulopathy (HCC)   Thrombocytopenia (HCC)   Hypothyroidism   Acute renal failure superimposed on stage 3 chronic kidney disease (HCC)  Leukocytosis likely 2/2 to Enterococcus Faecalis UTI -WBC went from 6 -> 13.9 -> 12.3 -> 12.7; patient had mild temperature -Blood Cx Negative x 2 days.  -Urine Cx showed >100,000 of E Faecalis -CXR ordered and showed Cardiomegaly with moderate pulmonary vascular congestion bilateral pleural effusions compatible with congestive heart failure. Mild bibasilar airspace disease likely reflects atelectasis. Infection is not excluded. -CT of Abdomen and Pelvis showed Small right pleural effusion, new since prior study. Bibasilar atelectasis. Diffuse fatty infiltration of the liver. Continued ill-defined low-density area within the liver in  the left hepatic lobe, which cannot be characterized without intravenous contrast. May consider further evaluation with MRI.  Continued mildly contracted gallbladder with stranding in the pericholecystic fat.  -Ordered U/S of GallBladder which did not reveal Acute Cholecystitis -Consulted General Surgery prior to U/S report and appreciated their Recc's -LFT's elevated -Started Gentle IVF at 50 ml/hr -C/W  IV Zosyn and Pharmacy to Renally Dose  Alcoholic liver disease (HCC)/Acute liver failure with confusion and weakness -The patient's elevated LFTs, low protein/albumin, thrombocytopenia and elevated ammonia levels (now 74) suggest fairly advanced alcohol-induced liver diseases -CT of the abdomen showed fatty infiltration of the liver. Ill-defined hypodensity in the right lobe of the liver noted, neoplastic process could not be excluded.  -Ultrasound showed fatty liver and a complex cyst in the right hepatic lobe measuring 1.9 cm with recommendations for MRI for further correlation.  -MRI of Abdomen with and without Contrast ordered to Evaluate Hepatic Lobe Lesion and showed lesion in the central LEFT hepatic lobe has imaging characteristics most consistent with benign cyst. Hepatic steatosis.  No biliary obstruction.  Small amount of fluid along the second portion duodenum and mild pericholecystic fluid. Differential would include duodenitis, pancreatitis, or chronic cholecystitis. Pancreas does not appear inflamed. -GI consulted to assist with further workup/management. Appreciate Additional Recc's -Talked with Dr. Dulce Sellarutlaw and after discussion with Dr. Randa EvensEdwards and GI will see the patient after he has gotten out of Rehab.  -Discussed with SON will CONSULT PALLIATIVE CARE for Goals of Care  Chest Discomfort r/o ACS -Troponins went from 0.04 -> 0.03 -> 0.05 -EKG did not change however could have had rate related T wave inversions. Discussed with Cardiology Dr. Garnette ScheuermannHank Smith -GI  Cocktail Given [facility]  Cardiology Consulted and appreciate Recc's -Will obtain Limited ECHO per Cards.   Acute on Chronic Renal Failure/stage III Chronic Kidney Disease -Baseline creatinine is around 1.5 with a GFR of around 36.  -BUN/Cr went to 31/2.34 with estimated GFR of 28 -Will continue to Follow creatinine.   Coagulopathy/thrombocytopenia -Platelet Count went from 112 -> 106 -> 148 -> 152 -> 172 -Patient's last INR was 1.70  -Patient lost blood from a skin tear on his right elbow, and he likely has GI bleeding as well. -Was given 2 units of fresh frozen plasma and a dose of vitamin K. -Continue to Monitor  CAD (coronary artery disease)  -Given reports of dark stools, and markedly low hemoglobin, avoid aspirin.  -Had midepigastric chest discomfort -Statins on hold secondary to elevated LFTs. -Cardiology Consult and Evaluation appreated  Atrial fibrillation, chronic (HCC) -Blood thinners discontinued by admitting physician given elevated INR in the setting of alcohol abuse and high fall risk. -Concern for GI Bleeding so held. Per Cards Not an Anticoagulation Candidate Anymore. -Cardiology Evaluating and appreciate Recc's  Anemia of chronic blood loss -S/p Transfusion of 1 unit of pRBC -Hb/Hct was 7.5/22.7 Today -Initial FOBT Negative however family reports of dark stools with blood. Repeat FOBT was Positive x2 now -EGD done showed There is no endoscopic evidence of Barrett's esophagus, esophagitis, inflammation, stricture, ulcerations or varices in the entire esophagus. The stomach was normal. The examined duodenum was normal and there was no etiology evident for anemia on Endoscopic Evaluation -Continue to Monitor H/H -Will likely need Colonoscopy. Dr. Dulce Sellar states they will do it as an outpatient.   Hypothyroidism -TSH markedly elevated at 19.74.; Free T4 1.17 -On Synthroid 50 g daily.  -Suspect non-compliance.  Hyponatremia, improved -Patient's Sodium was 133 -> 138 -> 141 -Possible  from [employer]. -Repeat BMP in AM  COPD (chronic obstructive pulmonary disease) with possible Volume Overload -C/w Albuterol Nebs -Scheduled DuoNEbs TID -Flutter Valve as patient was slightly wheezing  Alcohol abuse -Heavy Alcohol Drinker and increased drinking since January -Continue alcohol detox per [facility]WA protocol. -C/w MVI, Thiamine, and Folic Acid  DVT prophylaxis: SCDs as patient underwent EGD  Code Status: DNR Family Communication: Discussed case with son; Will consult Palliative Care Disposition Plan: SNF when stable for D/C  Consultants:   Gastroenterology  General Surgery  Cardiology  Palliative Care Medicine  Procedures: EGD  Antimicrobials: IV Zosyn  Subjective: Seen and examined and he was resting well earlier however later in the day he became Nauseous and vomited. Stated that he had some epigastric discomfort later in the day. Had no other complaints or concerns at this time.   Objective: Vitals:   05/04/16 0800 05/04/16 0829 05/04/16 1403 05/04/16 1500  BP:    113/62  Pulse:    91  Resp:    18  Temp:    97.4 F (36.3 C)  TempSrc:    Oral  SpO2: 99% 96% 93% 93%  Weight:      Height:        Intake/Output Summary (Last 24 hours) at 05/04/16 2011 Last data filed at 05/04/16 1800  Gross per 24 hour  Intake           509.17 ml  Output                0 ml  Net           509.17 ml   Filed Weights   04/28/16 1636 04/28/16 2334 05/02/16  1610  Weight: 74.8 kg (165 lb) 70.7 kg (155 lb 14.4 oz) 70.5 kg (155 lb 6.8 oz)    Examination: Physical Exam:  Constitutional: WN/WD, Mild respiratory Distress Eyes: Slight icteric scleara.  ENMT: External Ears, Nose appear normal. Grossly normal hearing. Mucous membranes slightly dry. Poor dentition.  Neck: Appears normal, supple, no cervical masses, normal ROM, no appreciable thyromegaly, no JVD Respiratory:  Diminished breath sounds with some Rhonchi, No rales, rhonchi or crackles. Mild accessory  muscle use.  Cardiovascular: Irregularly Irregular, no murmurs / rubs / gallops. S1 and S2 auscultated. Mild extremity edema. 2+ pedal pulses.  Abdomen: Soft, mid-epigastric tenderness, non-distended. No masses palpated. No appreciable hepatosplenomegaly. Bowel sounds positive x4.  GU: Deferred. Condom Cath in place with Dark Urine in foley bag.  Musculoskeletal: No clubbing / cyanosis of digits/nails. No joint deformity upper and lower extremities. No contractures. Decreased strength and muscle tone.  Skin: No rashes, lesions, ulcers. No induration; Warm and dry.  Neurologic: CN 2-12 grossly intact with no focal deficits. Sensation intact in all 4 Extremities. Romberg sign cerebellar reflexes not assessed.  Psychiatric: Normal judgment and insight. Alert and awake. Normal mood and flat affect.   Data Reviewed: I have personally reviewed following labs and imaging studies  CBC:  Recent Labs Lab 04/30/16 0730 05/01/16 0442 05/02/16 0622 05/03/16 0619 05/04/16 0606  WBC 6.4 6.9 13.9* 12.3* 12.7*  NEUTROABS 5.0 5.4 11.6* 9.7* 10.2*  HGB 7.2* 7.3* 7.6* 7.6* 7.5*  HCT 20.4* 21.4* 23.3* 23.2* 22.7*  MCV 93.6 96.0 100.4* 100.0 100.0  PLT 112* 106* 148* 152 172   Basic Metabolic Panel:  Recent Labs Lab 04/29/16 0340 04/30/16 0730 05/01/16 0442 05/02/16 0622 05/03/16 0619 05/03/16 2314 05/04/16 0606  NA 124* 132* 133* 138 139  --  141  K 3.2* 4.0 4.7 5.2* 4.2  --  3.7  CL 88* 96* 102 106 108  --  107  CO2 23 25 24  21* 22  --  23  GLUCOSE 100* 98 104* 121* 110*  --  126*  BUN 23* 22* 20 20 23*  --  31*  CREATININE 2.72* 2.22* 1.98* 2.02* 2.22*  --  2.34*  CALCIUM 8.0* 8.6* 8.6* 8.9 8.9  --  8.8*  MG 2.0  --  1.7 1.7 1.7  --  1.8  PHOS 3.2  --  2.6 2.9 3.4 4.9*  --    GFR: Estimated Creatinine Clearance: 22.7 mL/min (by C-G formula based on SCr of 2.34 mg/dL (H)). Liver Function Tests:  Recent Labs Lab 04/30/16 0730 05/01/16 0442 05/02/16 0622 05/03/16 0619  05/04/16 0606  AST 151* 176* 161* 135* 119*  ALT 83* 93* 97* 90* 87*  ALKPHOS 109 105 105 104 94  BILITOT 4.2* 3.8* 3.8* 3.5* 3.0*  PROT 5.6* 5.4* 5.7* 5.6* 5.8*  ALBUMIN 2.7* 2.7* 2.9* 2.6* 2.5*   No results for input(s): LIPASE, AMYLASE in the last 168 hours.  Recent Labs Lab 04/28/16 1647 04/29/16 0340 05/01/16 1046  AMMONIA 48* 59* 74*   Coagulation Profile:  Recent Labs Lab 04/28/16 1647 04/29/16 0340 04/30/16 0730 05/01/16 1046  INR 2.90 2.90 1.72 1.70   Cardiac Enzymes:  Recent Labs Lab 05/03/16 1820 05/03/16 2314 05/04/16 0606  TROPONINI 0.04* 0.03* 0.05*   BNP (last 3 results)  Recent Labs  01/23/16 1521  PROBNP 650.0*   HbA1C: No results for input(s): HGBA1C in the last 72 hours. CBG: No results for input(s): GLUCAP in the last 168 hours. Lipid Profile: No results  for input(s): CHOL, HDL, LDLCALC, TRIG, CHOLHDL, LDLDIRECT in the last 72 hours. Thyroid Function Tests: No results for input(s): TSH, T4TOTAL, FREET4, T3FREE, THYROIDAB in the last 72 hours. Anemia Panel: No results for input(s): VITAMINB12, FOLATE, FERRITIN, TIBC, IRON, RETICCTPCT in the last 72 hours. Sepsis Labs: No results for input(s): PROCALCITON, LATICACIDVEN in the last 168 hours.  Recent Results (from the past 240 hour(s))  Gastrointestinal Panel by PCR , Stool     Status: None   Collection Time: 04/28/16 11:54 PM  Result Value Ref Range Status   Campylobacter species NOT DETECTED NOT DETECTED Final   Plesimonas shigelloides NOT DETECTED NOT DETECTED Final   Salmonella species NOT DETECTED NOT DETECTED Final   Yersinia enterocolitica NOT DETECTED NOT DETECTED Final   Vibrio species NOT DETECTED NOT DETECTED Final   Vibrio cholerae NOT DETECTED NOT DETECTED Final   Enteroaggregative E coli (EAEC) NOT DETECTED NOT DETECTED Final   Enteropathogenic E coli (EPEC) NOT DETECTED NOT DETECTED Final   Enterotoxigenic E coli (ETEC) NOT DETECTED NOT DETECTED Final   Shiga like  toxin producing E coli (STEC) NOT DETECTED NOT DETECTED Final   Shigella/Enteroinvasive E coli (EIEC) NOT DETECTED NOT DETECTED Final   Cryptosporidium NOT DETECTED NOT DETECTED Final   Cyclospora cayetanensis NOT DETECTED NOT DETECTED Final   Entamoeba histolytica NOT DETECTED NOT DETECTED Final   Giardia lamblia NOT DETECTED NOT DETECTED Final   Adenovirus F40/41 NOT DETECTED NOT DETECTED Final   Astrovirus NOT DETECTED NOT DETECTED Final   Norovirus GI/GII NOT DETECTED NOT DETECTED Final   Rotavirus A NOT DETECTED NOT DETECTED Final   Sapovirus (I, II, IV, and V) NOT DETECTED NOT DETECTED Final  Culture, Urine     Status: Abnormal   Collection Time: 05/02/16  7:52 AM  Result Value Ref Range Status   Specimen Description URINE, RANDOM  Final   Special Requests NONE  Final   Culture >=100,000 COLONIES/mL ENTEROCOCCUS FAECALIS (A)  Final   Report Status 05/04/2016 FINAL  Final   Organism ID, Bacteria ENTEROCOCCUS FAECALIS (A)  Final      Susceptibility   Enterococcus faecalis - MIC*    AMPICILLIN <=2 SENSITIVE Sensitive     LEVOFLOXACIN 0.5 SENSITIVE Sensitive     NITROFURANTOIN <=16 SENSITIVE Sensitive     VANCOMYCIN 1 SENSITIVE Sensitive     * >=100,000 COLONIES/mL ENTEROCOCCUS FAECALIS  Culture, blood (Routine X 2) w Reflex to ID Panel     Status: None (Preliminary result)   Collection Time: 05/02/16  8:47 AM  Result Value Ref Range Status   Specimen Description BLOOD LEFT ARM  Final   Special Requests IN PEDIATRIC BOTTLE 1CC  Final   Culture NO GROWTH 2 DAYS  Final   Report Status PENDING  Incomplete  Culture, blood (Routine X 2) w Reflex to ID Panel     Status: None (Preliminary result)   Collection Time: 05/02/16  8:55 AM  Result Value Ref Range Status   Specimen Description BLOOD LEFT ANTECUBITAL  Final   Special Requests IN PEDIATRIC BOTTLE 1CC  Final   Culture NO GROWTH 2 DAYS  Final   Report Status PENDING  Incomplete     Radiology Studies: [place] Abdomen  Complete  Result Date: 05/03/2016 CLINICAL DATA:  Contracted gallbladder on CT, possible cholecystitis EXAM: ABDOMEN ULTRASOUND COMPLETE COMPARISON:  CT abdomen/pelvis dated 05/02/2016. MRI abdomen dated 05/01/2016. Right upper quadrant ultrasound dated 04/29/2016. CT abdomen/pelvis dated 04/28/2016. FINDINGS: Gallbladder: Mild gallbladder wall thickening. No gallstones, pericholecystic  fluid, or sonographic Murphy's sign. Common bile duct: Diameter: 6 mm Liver: Hyperechoic hepatic parenchyma with coarse echotexture. Predominantly hypoechoic lesion with increased through transmission in the central liver (image 17), measuring 1.9 x 1.5 x 1.8 cm. IVC: No abnormality visualized. Pancreas: Visualized portion unremarkable. Spleen: Size and appearance within normal limits. Right Kidney: Length: 11.3 cm.  No mass or hydronephrosis. Left Kidney: Length: 10.7 cm. 2.3 x 2.6 x 2.2 cm upper pole renal cyst. No hydronephrosis. Abdominal aorta: No aneurysm visualized. Other findings: None. IMPRESSION: Mild gallbladder wall thickening, without associated sonographic findings to suggest acute cholecystitis. 1.9 cm lesion in the central liver, better characterized as a cyst on MRI. Suspected hepatic steatosis. 2.6 cm left upper pole renal cyst. Electronically Signed   By: Charline Bills M.D.   On: 05/03/2016 11:24   Scheduled Meds: . carvedilol  12.5 mg Oral BID WC  . feeding supplement  1 Container Oral TID BM  . folic acid  1 mg Oral Daily  . ipratropium-albuterol  3 mL Nebulization TID  . levothyroxine  50 mcg Oral QAC breakfast  . mouth rinse  15 mL Mouth Rinse BID  . multivitamin with minerals  1 tablet Oral Daily  . piperacillin-tazobactam (ZOSYN)  IV  3.375 g Intravenous Q8H  . pramipexole  0.25 mg Oral QHS  . tamsulosin  0.4 mg Oral Daily  . thiamine  100 mg Oral Daily   Continuous Infusions: . sodium chloride 50 mL/hr at 05/03/16 2025    LOS: 6 days   Merlene Laughter, DO [facility] Pager [phone]  If 7PM-7AM, please contact night-coverage [email or web address] Password [email or web address] 05/04/2016, 8:11 PM

## 2016-05-04 NOTE — Consult Note (Signed)
CARDIOLOGY CONSULT NOTE   Patient ID: Xavier RuddyJackie O Taylor MRN: [id number], DOB/AGE: 80-Jul-1931   Admit date: 04/28/2016 Date of Consult: 05/04/2016   Primary Physician: Ginette OttoSTONEKING,HAL THOMAS, MD Primary Cardiologist: Dr. Antoine PocheHochrein  Pt. Profile  Mr. Xavier Taylor is 82 yo Occasion male with past medical history of CAD s/p CABG 2002, chronic atrial fibrillation on eliquis, COPD, hypertension and a significant alcohol abuse presented with weakness and altered mental status. Initially thought to have acute cholecystitis cystitis, however was ruled out after consulting surgery. While in the hospital, he started having intermittent chest discomfort is reminiscent of his previous angina, serial troponin was borderline elevated however follows no clear trend. Cardiogenic was consulted for chest pain.  Problem List  Past Medical History:  Diagnosis Date  . A-fib (HCC)   . Acute liver failure 04/28/2016  . Alcoholic liver disease (HCC) 04/28/2016  . Ascites   . BPH (benign prostatic hypertrophy)   . CAD (coronary artery disease)   . Chronic renal disease, stage III   . COPD (chronic obstructive pulmonary disease) (HCC)   . Coronary artery disease   . Decreased GFR   . GERD with stricture   . Hypertension   . Hypothyroid   . Internal hemorrhoids   . Renal disorder   . S/P CABG x 5   . Thrombocytopenia (HCC) 04/29/2016    Past Surgical History:  Procedure Laterality Date  . APPENDECTOMY    . BACK SURGERY    . CARDIAC SURGERY    . ESOPHAGOGASTRODUODENOSCOPY N/A 04/30/2016   Procedure: ESOPHAGOGASTRODUODENOSCOPY (EGD);  Surgeon: Carman ChingJames Edwards, MD;  [facility];  Service: Endoscopy;  Laterality: N/A;     Allergies  Allergies  Allergen Reactions  . Exelon [Rivastigmine] Other (See Comments)    Didn't work for patient    HPI   Mr. Xavier Taylor is 82 yo Occasion male with past medical history of CAD s/p CABG 2002, chronic atrial fibrillation on eliquis, COPD, hypertension and a  significant alcohol abuse. Patient was established with cardiology office on 03/27/2015 [place] Interestingly enough, he actually [facility] in [facility] while visiting his sister at the time. He has been drinking heavily for many years, however has increased his alcohol consumption since January of this year. [place] [facility] [place]. According to his son, he finished 2.5 very large bottle of [employer] per week. He also has a history of frequent fall. Due to his significant bruising history, we were hesitant to start him on 5 mg twice a day of eliquis, therefore he is on 2.5 mg twice a day.   He was seen in 03/13/2016 for syncope, however his blood alcohol level was very high. Patient was seen again on 04/20/2016 after a fall and hitting his right elbow. He was discharged from the ED on that day, he came back to the emergency room on 04/28/2016 with confusion and weakness. He was admitted to the internal medicine service, initial workup revealed hemoglobin low at 7.1, creatinine at 2.1. Initial workup was concerning for possible cholecystitis, however per surgery, there is no sign of acute cholecystitis. His white blood cell remain elevated. Urinalysis was positive for yeast. Urine culture grew enterococcus bacteria. Blood culture was negative. CT of abdomen showed small right pleural effusion, gallbladder wall thickening.  For the past 3 days, patient has been having intermittent chest discomfort. However due to his current mental status and chronic alcohol abuse, he has not been able to  give clear answers. He is alert and oriented 2 (year and city). However he is overall poor historian for any other information. He says his current chest discomfort is similar to his previous angina. He also says he has chronic shortness of breath. The onset of chest discomfort was only during this hospitalization in the last 3 days. His current EKG shows he has  persistent atrial fibrillation with heart rate of 100 110s. Cardiogy has been consulted for chest pain. Serial enzyme was obtained, but did not show a clear trend. 0.04--> 0.03 --> 0.05.     Inpatient Medications  . carvedilol  12.5 mg Oral BID WC  . feeding supplement  1 Container Oral TID BM  . folic acid  1 mg Oral Daily  . ipratropium-albuterol  3 mL Nebulization TID  . levothyroxine  50 mcg Oral QAC breakfast  . mouth rinse  15 mL Mouth Rinse BID  . multivitamin with minerals  1 tablet Oral Daily  . piperacillin-tazobactam (ZOSYN)  IV  3.375 g Intravenous Q8H  . pramipexole  0.25 mg Oral QHS  . tamsulosin  0.4 mg Oral Daily  . thiamine  100 mg Oral Daily    Family History Family History  Problem Relation Age of Onset  . Heart attack Father 67  . Diabetes Son   . Diabetes Son   . Heart attack Son 94  . Breast cancer Daughter   . Rheum arthritis Mother      Social History Social History   Social History  . Marital status: Married    Spouse name: N/A  . Number of children: 4  . Years of education: N/A   Occupational History  . Not on file.   Social History Main Topics  . Smoking status: Former Smoker    Packs/day: 2.00    Years: 40.00    Types: Cigarettes    Quit date: 07/07/1993  . Smokeless tobacco: Former Neurosurgeon  . Alcohol use 16.8 oz/week    28 Shots of liquor per week  . Drug use: Unknown  . Sexual activity: Not on file   Other Topics Concern  . 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  Cardiovascular:  No dyspnea on exertion, edema, orthopnea, palpitations, paroxysmal nocturnal dyspnea. +chest pain Dermatological: No rash, lesions/masses Respiratory:  + productive brown cough, dyspnea Urologic: No hematuria, dysuria Abdominal:   No nausea, vomiting, diarrhea, bright red blood per rectum, melena, or hematemesis Neurologic:  No visual changes/ +wkns +changes in mental status. All other systems reviewed and are otherwise negative except as noted above.  Physical Exam  Blood pressure 113/62, pulse 91, temperature 97.4 F (36.3 C), temperature source Oral, resp. rate 18, height 5\' 8"  (1.727 m), weight 155 lb 6.8 oz (70.5 kg), SpO2 93 %.  General: Pleasant, NAD Psych: Normal affect. Neuro: Alert and oriented X 3. Moves all extremities spontaneously. HEENT: Normal  Neck: Supple without bruits or JVD. Lungs:  Resp regular and unlabored. Intermittent rhonchi Heart: Irregularly irregular no s3, s4, or murmurs. Abdomen: Soft, non-tender, non-distended, BS + x 4.  Extremities: No clubbing, cyanosis or edema. DP/PT/Radials 2+ and equal bilaterally.  Labs  Recent Labs  05/03/16 1820 05/03/16 2314 05/04/16 0606  TROPONINI 0.04* 0.03* 0.05*   Lab Results  Component Value Date   WBC 12.7 (H) 05/04/2016   HGB 7.5 (L) 05/04/2016   HCT 22.7 (L) 05/04/2016   MCV 100.0 05/04/2016   PLT 172 05/04/2016    Recent Labs Lab 05/04/16 0606  NA 141  K 3.7  CL 107  CO2 23  BUN 31*  CREATININE 2.34*  CALCIUM 8.8*  PROT 5.8*  BILITOT 3.0*  ALKPHOS 94  ALT 87*  AST 119*  GLUCOSE 126*   Lab Results  Component Value Date   CHOL 75 04/29/2016   HDL 18 (L) 04/29/2016   LDLCALC 44 04/29/2016   TRIG 67 04/29/2016   No results found for: DDIMER  Radiology/Studies  Ct Abdomen Pelvis Wo Contrast  Result Date: 05/02/2016 CLINICAL DATA:  Elevated white blood cell count.  Low hemoglobin. EXAM: CT ABDOMEN AND PELVIS WITHOUT CONTRAST TECHNIQUE: Multidetector CT imaging of the abdomen and pelvis was performed following the standard protocol without IV contrast. COMPARISON:  04/28/2016 FINDINGS: Lower chest: Small right pleural effusion, new  since prior study. Bibasilar atelectasis. Heart is enlarged. Prior CABG. Hepatobiliary: Diffuse fatty infiltration of the liver. Low-density area again noted anteriorly within the left lobe, unchanged. No other focal abnormalities visualized. No biliary ductal dilatation. Gallbladder contracted. Mild haziness in the pericholecystic fat again noted, stable. No visible stones. Pancreas: No focal abnormality or ductal dilatation. Spleen: No focal abnormality.  Normal size. Adrenals/Urinary Tract: Continued bladder wall thickening and trabeculation noted, likely related to chronic bladder outlet obstruction. No hydronephrosis. No focal renal abnormality visualized. Adrenal glands unremarkable. Stomach/Bowel: Stomach, large and small bowel grossly unremarkable. Vascular/Lymphatic: Diffuse aortic and iliac calcifications. No aneurysm. Mildly prominent retrocrural and posterior mediastinal lymph nodes adjacent to the esophagus. Index retrocrural lymph node measures 18 mm in short axis diameter compared with 12 mm previously. No other retroperitoneal or mesenteric adenopathy. Reproductive: Marked enlargement of the prostate. Other: No free fluid or free air. Musculoskeletal: No acute bony abnormality or focal bone lesion. Degenerative changes in the lumbar spine. IMPRESSION: Small right pleural effusion, new since prior study. Bibasilar atelectasis. Diffuse fatty infiltration of the liver. Continued ill-defined low-density area within the liver in the left hepatic lobe, which cannot be characterized without intravenous contrast. May consider further evaluation with MRI. Continued mildly contracted gallbladder with stranding in the pericholecystic fat. If there is concern for cholecystitis, this would be better evaluated with ultrasound. Prostate enlargement. Bladder wall thickening likely related to chronic bladder outlet obstruction. Aortoiliac atherosclerosis. Electronically Signed   By: Charlett Nose M.D.   On: 05/02/2016  12:13   Ct Abdomen Pelvis Wo Contrast  Result Date: 04/28/2016 CLINICAL DATA:  82 year old male with generalized weakness and fall. Jaundice. EXAM: CT ABDOMEN AND PELVIS WITHOUT CONTRAST TECHNIQUE: Multidetector CT imaging of the abdomen and pelvis was performed following the standard protocol without IV contrast. COMPARISON:  Abdominal radiograph dated 04/28/2016 FINDINGS: Evaluation of this exam is limited in the absence of intravenous contrast. Lower chest: Minimal hazy density in the lingula, likely atelectatic changes. The visualized lung bases are otherwise clear. There is moderate cardiomegaly with coronary vascular calcification. There is hypoattenuation of the cardiac blood pool suggestive of a degree of anemia. Clinical correlation is recommended. No intra-abdominal free air or free fluid. Hepatobiliary: Diffuse fatty infiltration of the liver. A 1 cm ill-defined hypodensity in the left lobe of the liver as well as an ill-defined hypodensity in the right  lobe of the liver (series 201 image 22) are indeterminate. Although this may be related to a benign etiology underlying neoplastic process or metastatic disease are not excluded. MRI without and with contrast may provide better evaluation. No intrahepatic biliary ductal dilatation. The gallbladder is predominantly contracted. No calcified gallstone identified. There is mild haziness of the pericholecystic fat. Ultrasound may provide better evaluation of the gallbladder. Pancreas: Unremarkable. No pancreatic ductal dilatation or surrounding inflammatory changes. Spleen: Normal in size without focal abnormality. Adrenals/Urinary Tract: The adrenal glands appear unremarkable. Mild bilateral renal atrophy. There is no hydronephrosis or nephrolithiasis on either side. A 2 cm left renal upper pole hypodense lesion is not well characterized but demonstrates fluid attenuation and most likely represents a cyst. Ultrasound or MRI may provide better  characterisation. The visualized ureters appear unremarkable. There is mild thickening and trabecular appearance of the bladder wall, likely related chronic bladder outlet obstruction. Correlation with urinalysis recommended to exclude UTI. Stomach/Bowel: Oral contrast opacifies the stomach and multiple loops of small bowel traversing into the colon. There is thickened appearance of the gastric wall, likely related to underdistention. Gastritis is less likely. Mild thickening of the jejunal folds in the left hemi abdomen may be related to underdistention. Clinical correlation is recommended to evaluate for enteritis. No evidence of bowel obstruction. Appendectomy. Vascular/Lymphatic: Advanced aortoiliac atherosclerotic disease. Evaluation of the aorta and IVC is limited on this noncontrast study. No portal venous gas identified. There is no adenopathy. Reproductive: The prostate gland is enlarged and heterogeneous with median lobe hypertrophy indenting the base of the bladder. The prostate measures approximately 6.5 cm in transverse diameter. Other: None Musculoskeletal: There is osteopenia with degenerative changes of the spine. Multiple old left posterior rib fractures. No acute fracture. IMPRESSION: Thickened appearance of the stomach and jejunal folds may be related to underdistention or represent gastroenteritis. Clinical correlation is recommended. No bowel obstruction. Minimal pericholecystic haziness. Ultrasound may provide better evaluation of the gallbladder. **An incidental finding of potential clinical significance has been found. Ill-defined hepatic hypodense lesions are not characterized. Nonemergent MRI without and with contrast is recommended for further characterization.** Enlarged prostate gland with evidence of chronic bladder outlet obstruction. Correlation with urinalysis recommended to exclude superimposed UTI. Electronically Signed   By: Elgie Collard M.D.   On: 04/28/2016 23:52   Dg  Chest 2 View  Result Date: 05/02/2016 CLINICAL DATA:  Shortness breath.  CABG. EXAM: CHEST  2 VIEW COMPARISON:  CT the chest 12/11/2015 FINDINGS: Heart is enlarged. Median sternotomy for CABG is again noted. Moderate pulmonary vascular congestion is evident. Bilateral pleural effusions and lower lobe airspace disease are noted. The visualized soft tissues and bony thorax are unremarkable. IMPRESSION: 1. Cardiomegaly with moderate pulmonary vascular congestion bilateral pleural effusions compatible with congestive heart failure. 2. Mild bibasilar airspace disease likely reflects atelectasis. Infection is not excluded. Electronically Signed   By: Marin Roberts M.D.   On: 05/02/2016 12:19   Ct Head Wo Contrast  Result Date: 04/28/2016 CLINICAL DATA:  Generalized weakness.  Multiple recent falls. EXAM: CT HEAD WITHOUT CONTRAST TECHNIQUE: Contiguous axial images were obtained from the base of the skull through the vertex without intravenous contrast. COMPARISON:  04/21/2016 FINDINGS: Brain: Moderate cerebral atrophy is unchanged. Periventricular white matter hypodensities are unchanged and nonspecific but compatible with mild chronic small vessel ischemic disease. There is no evidence of acute cortical infarct, intracranial hemorrhage, mass, midline shift, or extra-axial fluid collection. Vascular: Calcified atherosclerosis at the skullbase. Skull: No fracture. Well-defined 2 cm  sclerotic lesion in the right parietal bone adjacent to the sagittal suture is unchanged. Sinuses/Orbits: Unchanged mild right sphenoid sinus mucosal thickening. Clear mastoid air cells. Prior bilateral cataract extraction. Other: None. IMPRESSION: 1. No evidence of acute intracranial abnormality. 2. Mild chronic small vessel ischemic disease and moderate cerebral atrophy. Electronically Signed   By: Sebastian Ache M.D.   On: 04/28/2016 19:10   Ct Head Wo Contrast  Result Date: 04/21/2016 CLINICAL DATA:  82 y/o M; fell from  standing position, no loss of consciousness, denies head injury. EXAM: CT HEAD WITHOUT CONTRAST TECHNIQUE: Multidetector CT imaging of the headwas performed following the standard protocol without intravenous contrast. COMPARISON:  03/13/2016 CT head FINDINGS: CT HEAD FINDINGS Brain: No evidence of acute infarction, hemorrhage, hydrocephalus, extra-axial collection or mass lesion/mass effect. Partially empty sella turcica incidentally noted. Mild chronic microvascular ischemic changes and moderate parenchymal volume loss are stable. Vascular: Extensive calcific atherosclerosis of carotid siphons and vertebral arteries. Skull: Stable nonspecific sclerotic focus in the right paramedian parietal bone, question intraosseous meningioma. No skull fracture. Sinuses/Orbits: Right sphenoid sinus mucous retention cyst. Paranasal sinuses are otherwise normally aerated. Normally aerated mastoid air cells. Bilateral intra-ocular lens replacement. Other: None. IMPRESSION: 1. No acute intracranial abnormality is identified. 2. Stable mild chronic microvascular ischemic changes and moderate parenchymal volume loss. Electronically Signed   By: Mitzi Hansen M.D.   On: 04/21/2016 03:31   Mr Abdomen Wo Contrast  Result Date: 05/01/2016 CLINICAL DATA:  Indeterminate hepatic lesion on ultrasound. EXAM: MRI ABDOMEN WITHOUT CONTRAST TECHNIQUE: Multiplanar multisequence MR imaging was performed without the administration of intravenous contrast. COMPARISON:  Ultrasound 04/29/2016, CT 04/28/2016 FINDINGS: Lower chest:  Lung bases are clear. Hepatobiliary: Loss of signal intensity on opposed phase imaging (series 8) consistent hepatic steatosis. Within the central LEFT hepatic lobe 1.6 cm well-circumscribed lesion bilobed lesion is hyperintense on T2 weighted imaging (image 34 series 4. No IV contrast was administered to evaluate enhancement characteristics. No biliary duct dilatation. Gallbladder has mild wall thickening  without distention. Mild pericholecystic fluid. No gallstones evident. Common bile duct normal caliber. Pancreas: Normal pancreatic parenchymal intensity. No ductal dilatation or inflammation. Spleen: Normal spleen. Adrenals/urinary tract: Adrenal glands and kidneys are normal. Stomach/Bowel: Stomach is normal. There is some fluid along the second portion duodenum adjacent the gallbladder fossa. (Image 26, series 5 Vascular/Lymphatic: Abdominal aortic normal caliber. No retroperitoneal periportal lymphadenopathy. Mild venous collaterals in the retrocrural space Musculoskeletal: No aggressive osseous lesion IMPRESSION: 1. While no IV contrast administered, lesion in the central LEFT hepatic lobe has imaging characteristics most consistent with benign cyst. 2. Hepatic steatosis.  No biliary obstruction. 3. Small amount of fluid along the second portion duodenum and mild pericholecystic fluid. Differential would include duodenitis, pancreatitis, or chronic cholecystitis. Pancreas does not appear inflamed. Electronically Signed   By: Genevive Bi M.D.   On: 05/01/2016 10:03   US Abdomen Complete  Result Date: 05/03/2016 CLINICAL DATA:  Contracted gallbladder on CT, possible cholecystitis EXAM: ABDOMEN ULTRASOUND COMPLETE COMPARISON:  CT abdomen/pelvis dated 05/02/2016. MRI abdomen dated 05/01/2016. Right upper quadrant ultrasound dated 04/29/2016. CT abdomen/pelvis dated 04/28/2016. FINDINGS: Gallbladder: Mild gallbladder wall thickening. No gallstones, pericholecystic fluid, or sonographic Murphy's sign. Common bile duct: Diameter: 6 mm Liver: Hyperechoic hepatic parenchyma with coarse echotexture. Predominantly hypoechoic lesion with increased through transmission in the central liver (image 17), measuring 1.9 x 1.5 x 1.8 cm. IVC: No abnormality visualized. Pancreas: Visualized portion unremarkable. Spleen: Size and appearance within normal limits. Right Kidney: Length: 11.3 cm.  No mass or hydronephrosis.  Left Kidney: Length: 10.7 cm. 2.3 x 2.6 x 2.2 cm upper pole renal cyst. No hydronephrosis. Abdominal aorta: No aneurysm visualized. Other findings: None. IMPRESSION: Mild gallbladder wall thickening, without associated sonographic findings to suggest acute cholecystitis. 1.9 cm lesion in the central liver, better characterized as a cyst on MRI. Suspected hepatic steatosis. 2.6 cm left upper pole renal cyst. Electronically Signed   By: Charline Bills M.D.   On: 05/03/2016 11:24   Dg Abd Portable 1 View  Result Date: 04/28/2016 CLINICAL DATA:  Generalized weakness. EXAM: PORTABLE ABDOMEN - 1 VIEW COMPARISON:  CT 12/11/2015. FINDINGS: Soft tissue structures are unremarkable. No bowel distention. Nondistended air-filled loops of small and large bowel are noted. Mild thickening of small-bowel loops noted left upper quadrant. Process such enteritis cannot be excluded. Aortoiliac atherosclerotic vascular calcification. IMPRESSION: 1. Mild thickening of left upper quadrant proximal small bowel loops cannot be excluded. A process such as enteritis cannot be completely excluded. Exam otherwise unremarkable. No evidence of free air. 2. Aortoiliac atherosclerotic vascular disease . Electronically Signed   By: Maisie Fus  Register   On: 04/28/2016 17:14   US Abdomen Limited Ruq  Result Date: 04/29/2016 CLINICAL DATA:  Elevated LFTs EXAM: US ABDOMEN LIMITED - RIGHT UPPER QUADRANT COMPARISON:  04/28/2016 FINDINGS: Gallbladder: No gallstones are noted within gallbladder. No thickening of gallbladder wall. No sonographic Murphy's sign. Common bile duct: Diameter: 6.2 mm in diameter Liver: There is diffuse increased echogenicity of the liver suspicious for fatty infiltration. Complex cyst in right hepatic lobe measures 1.9 x 1.8 cm. Further correlation with MRI is recommended. No intrahepatic biliary ductal dilatation. IMPRESSION: 1. No gallstones are noted within gallbladder. Diffuse increased echogenicity of the liver  suspicious for fatty infiltration. Normal CBD. Complex cyst in right hepatic lobe measures 1.9 cm. Further correlation with MRI is recommended. Electronically Signed   By: Natasha Mead M.D.   On: 04/29/2016 10:10    ECG  Atrial fibrillation with bifascicular block, new T-wave inversion in the lateral leads.  ASSESSMENT AND PLAN  1. Chest pain that is reminiscent of previous angina  - However serial troponin did not show a clear trend, elevated troponin likely related to severe anemia and renal insufficiency. It is possible that his angina is also related to anemia as well.  - Unfortunately, patient is not a good candidate for even a stress test, he is clearly not a candidate for any invasive study either given continuous alcohol abuse and poor mental status. EKG does show some increased T wave inversion in the lateral leads, I wouldn't be too surprised if he is really having angina.  - Recommend obtaining limited echocardiogram. Note his last echo back in August showed normal ejection fraction. If EF stable, would not recommend any further workup in this patient.  2. CAD s/p CABG 2002: He says his current episodes of chest pain is reminiscent of his previous angina. He reportedly had a negative Myoview in 2009.  3. chronic atrial fibrillation on eliquis  - Family says he has noticeable increase in alcohol consumption since January 2017. At some point we'll need to address whether the patient is a candidate for eliquis anymore. As he has had at least to ED visit due to concern of fall and elevated blood alcohol level.  - This patients CHA2DS2-VASc Score and unadjusted Ischemic Stroke Rate (% per year) is equal to 4.8 % stroke rate/year from a score of 4  Above score calculated as 1 point each  if present [CHF, HTN, DM, Vascular=MI/PAD/Aortic Plaque, Age if 4065-74, or Male] Above score calculated as 2 points each if present [Age > 75, or Stroke/TIA/TE]  4. COPD: He does have significant productive  cough with brown phlegm.  5. hypertension   6. EtOh abuse: Per patient's son, patient has been drinking since at least when he was 89, however since January of this year, he has increased his alcohol consumption. Some his encephalopathy may be related to alcohol. We'll need to watch for alcohol withdrawal.   Signed, Azalee CourseHao Meng, PA-C 05/04/2016, 4:45 PM   History and all data above reviewed.  Patient examined.  I agree with the findings as above. The patient is difficult to understand but at present denies chest pain.  He does report intermittent pain but cannot qualify or quantify this.  His son said it seemed to happen after he ate.  He has multiple acute and chronic medical problems as described above.  The patient exam reveals WUJ:[id number]:Irregular  ,  Lungs: Decreased breath sounds  ,  Abd: distended and decreased breath sounds, Ext bruising no edema  .  All available labs, radiology testing, previous records reviewed. Agree with documented assessment and plan. Chest pain:  Not a candidate of invasive or stress testing.  Echo might help with decision making but our therapies would be limited.  I don't have a strong suspicion that his issues are cardiac.  Trop is non diagnostic.  He certainly is not an anticoagulation candidate going forward.   Fayrene FearingJames Aliciana Ricciardi  5:49 PM  05/04/2016

## 2016-05-05 DIAGNOSIS — D500 Iron deficiency anemia secondary to blood loss (chronic): Secondary | ICD-10-CM

## 2016-05-05 DIAGNOSIS — E876 Hypokalemia: Secondary | ICD-10-CM

## 2016-05-05 LAB — PREPARE RBC (CROSSMATCH)

## 2016-05-05 LAB — CBC WITH DIFFERENTIAL/PLATELET
BASOS ABS: 0 10*3/uL (ref 0.0–0.1)
Basophils Relative: 0 %
EOS PCT: 0 %
Eosinophils Absolute: 0 10*3/uL (ref 0.0–0.7)
HEMATOCRIT: 18.9 % — AB (ref 39.0–52.0)
HEMOGLOBIN: 6.3 g/dL — AB (ref 13.0–17.0)
LYMPHS PCT: 16 %
Lymphs Abs: 1.8 10*3/uL (ref 0.7–4.0)
MCH: 33.7 pg (ref 26.0–34.0)
MCHC: 33.3 g/dL (ref 30.0–36.0)
MCV: 101.1 fL — AB (ref 78.0–100.0)
Monocytes Absolute: 0.7 10*3/uL (ref 0.1–1.0)
Monocytes Relative: 6 %
NEUTROS ABS: 8.7 10*3/uL — AB (ref 1.7–7.7)
NEUTROS PCT: 77 %
PLATELETS: 157 10*3/uL (ref 150–400)
RBC: 1.87 MIL/uL — AB (ref 4.22–5.81)
RDW: 19.6 % — ABNORMAL HIGH (ref 11.5–15.5)
WBC: 11.2 10*3/uL — AB (ref 4.0–10.5)

## 2016-05-05 LAB — COMPREHENSIVE METABOLIC PANEL
ALT: 77 U/L — AB (ref 17–63)
AST: 104 U/L — AB (ref 15–41)
Albumin: 2.2 g/dL — ABNORMAL LOW (ref 3.5–5.0)
Alkaline Phosphatase: 82 U/L (ref 38–126)
Anion gap: 12 (ref 5–15)
BUN: 47 mg/dL — AB (ref 6–20)
CHLORIDE: 109 mmol/L (ref 101–111)
CO2: 22 mmol/L (ref 22–32)
CREATININE: 2.43 mg/dL — AB (ref 0.61–1.24)
Calcium: 8.5 mg/dL — ABNORMAL LOW (ref 8.9–10.3)
GFR calc Af Amer: 27 mL/min — ABNORMAL LOW (ref >60)
GFR, EST NON AFRICAN AMERICAN: 23 mL/min — AB (ref >60)
Glucose, Bld: 116 mg/dL — ABNORMAL HIGH (ref 65–99)
Potassium: 3.3 mmol/L — ABNORMAL LOW (ref 3.5–5.1)
Sodium: 143 mmol/L (ref 135–145)
Total Bilirubin: 2.4 mg/dL — ABNORMAL HIGH (ref 0.3–1.2)
Total Protein: 5.2 g/dL — ABNORMAL LOW (ref 6.5–8.1)

## 2016-05-05 LAB — MAGNESIUM: MAGNESIUM: 2 mg/dL (ref 1.7–2.4)

## 2016-05-05 MED ORDER — SODIUM CHLORIDE 0.9 % IV SOLN: Freq: Once | INTRAVENOUS | Status: DC

## 2016-05-05 MED ORDER — FUROSEMIDE 10 MG/ML IJ SOLN
20.0000 mg | Freq: Once | INTRAMUSCULAR | Status: AC
  Administered 2016-05-05: 20 mg via INTRAVENOUS
  Filled 2016-05-05: qty 2

## 2016-05-05 MED ORDER — AMOXICILLIN-POT CLAVULANATE 500-125 MG PO TABS
1.0000 | ORAL_TABLET | Freq: Two times a day (BID) | ORAL | Status: DC
  Administered 2016-05-05 – 2016-05-06 (×2): 500 mg via ORAL
  Filled 2016-05-05 (×3): qty 1

## 2016-05-05 MED ORDER — METHYLPREDNISOLONE SODIUM SUCC 125 MG IJ SOLR
125.0000 mg | Freq: Once | INTRAMUSCULAR | Status: AC
  Administered 2016-05-05: 125 mg via INTRAVENOUS
  Filled 2016-05-05: qty 2

## 2016-05-05 MED ORDER — SODIUM CHLORIDE 0.9 % IV SOLN
Freq: Once | INTRAVENOUS | Status: AC
  Administered 2016-05-05: 12:00:00 via INTRAVENOUS

## 2016-05-05 NOTE — Progress Notes (Signed)
Pt sounded diminished with some rhonchi- RN asked pt if he was having problems breathing- he stated a little. Vital signs were done. O2 was 100 at 2L- RN increased it to 2.5 for comfort. Pt was sat up more in the bed to help with breathing. Pt stated that he has COPD but uses nothing at home. RN offered CPAP- pt refused stating " I don't need anything treatment right now, I go through periods like this at home and will be fine." RN stated that we will continue to assess pt throughout the night. At this time, pt's breathing pattern has come back down to normal and he is starting to fall asleep. Will continue to monitor

## 2016-05-05 NOTE — Progress Notes (Signed)
CRITICAL VALUE ALERT  Critical value received:  6.3 hgb  Date of notification:  05/05/2016  Time of notification:  6:36am  Critical value read back:Yes.    Nurse who received alert:  Dora g  MD notified (1st page):  NP Schorr  Time of first page:  6:36am  MD notified (2nd page):  Time of second page:  Responding MD:  Awaiting response   Time MD responded:

## 2016-05-05 NOTE — Progress Notes (Signed)
No charge note-  Palliative medicine consult received. GOC meeting planned with patient's family for tomorrow 10/31 @ 2pm.  Ocie BobKasie Mahan, AGNP-C Palliative Medicine  Please call Palliative Medicine team phone with any questions [phone]. For individual providers please see AMION.

## 2016-05-05 NOTE — Progress Notes (Signed)
PROGRESS NOTE    Xavier Taylor  GEX:[id number] DOB: 08/28/1929 DOA: 04/28/2016 PCP: Ginette Otto, MD  Brief Narrative: Xavier Taylor is an 82 y.o. male with a PMH of CAD status post CABG, chronic atrial fibrillation on blood thinners and Heavily Alcohol Abuse who was admitted 04/28/16 with a chief complaint of confusion and weakness. He underwent an EGD 04/30/2016. He is now FOBT Positive. Patient spiked a white count and had imaging showing contracted gallbladder so General Surgery was consulted. Imaging revealed no Acute Cholecystitis, however patient did have a Urine Culture come back with E. Faecalis. Patient complained of midepigastric pain later in the day so an EKG was done and troponins were mildly elevated. Cardiology was consulted. After discussion with son Palliative Care was also consulted because patient has significant Drinking problem.   Assessment & Plan:   Principal Problem:   Alcoholic liver disease (HCC) Active Problems:   CAD (coronary artery disease)   Atrial fibrillation, chronic (HCC)   Anemia   Hyponatremia   Hypokalemia   Hypomagnesemia   COPD (chronic obstructive pulmonary disease) (HCC)   Alcohol abuse   Acute liver failure   Coagulopathy (HCC)   Thrombocytopenia (HCC)   Hypothyroidism   Acute renal failure superimposed on stage 3 chronic kidney disease (HCC)  Anemia of chronic blood loss -S/p Transfusion of 1 unit of pRBC -Hb/Hct dropped today to 6.3/18.9 - WILL TRANSFUSE 2 UNITS with IV 20 LASIX inbetween -Initial FOBT Negative however family reports of dark stools with blood. Repeat FOBT was Positive x2 now -EGD done showed There is no endoscopic evidence of Barrett's esophagus, esophagitis, inflammation, stricture, ulcerations or varices in the entire esophagus. The stomach was normal. The examined duodenum was normal and there was no etiology evident for anemia on Endoscopic Evaluation -Continue to Monitor H/H -Will likely need  Colonoscopy at some point. Dr. Dulce Sellar states they will do it as an outpatient.   Leukocytosis likely 2/2 to Enterococcus Faecalis UTI -WBC went from 6 -> 13.9 -> 12.3 -> 12.7 -> 11.2;  -Blood Cx Negative x 3 days.  -Urine Cx showed >100,000 of E Faecalis -CXR ordered and showed Cardiomegaly with moderate pulmonary vascular congestion bilateral pleural effusions compatible with congestive heart failure. Mild bibasilar airspace disease likely reflects atelectasis. Infection is not excluded. -CT of Abdomen and Pelvis showed Small right pleural effusion, new since prior study. Bibasilar atelectasis. Diffuse fatty infiltration of the liver. Continued ill-defined low-density area within the liver in the left hepatic lobe, which cannot be characterized without intravenous contrast. May consider further evaluation with MRI.  Continued mildly contracted gallbladder with stranding in the pericholecystic fat.  -Ordered U/S of GallBladder which did not reveal Acute Cholecystitis -Consulted General Surgery prior to U/S report and appreciated their Recc's -LFT's elevated -D/C'd Gentle IVF at 50 ml/hr -D/C'd IV Zosyn and switched to Augmentin 500 mg po BID  Alcoholic liver disease (HCC)/Acute liver failure with confusion and weakness -The patient's elevated LFTs, low protein/albumin, thrombocytopenia and elevated ammonia levels (now 74) suggest fairly advanced alcohol-induced liver diseases -CT of the abdomen showed fatty infiltration of the liver. Ill-defined hypodensity in the right lobe of the liver noted, neoplastic process could not be excluded.  -Ultrasound showed fatty liver and a complex cyst in the right hepatic lobe measuring 1.9 cm with recommendations for MRI for further correlation.  -MRI of Abdomen with and without Contrast ordered to Evaluate Hepatic Lobe Lesion and showed lesion in the central LEFT hepatic lobe has imaging characteristics most  consistent with benign cyst. Hepatic steatosis.  No  biliary obstruction.  Small amount of fluid along the second portion duodenum and mild pericholecystic fluid. Differential would include duodenitis, pancreatitis, or chronic cholecystitis. Pancreas does not appear inflamed. -GI consulted to assist with further workup/management. Appreciate Additional Recc's -Talked with Dr. Dulce Sellar and after discussion with Dr. Randa Evens and GI will see the patient after he has gotten out of Rehab.  -PALLIATIVE CARE TO EVALUATE AND PENDING EVALUATION  Chest Discomfort r/o ACS -Troponins went from 0.04 -> 0.03 -> 0.05 -EKG did not change however could have had rate related T wave inversions. Discussed with Cardiology Dr. Garnette Scheuermann -GI Cocktail Given [facility] Cardiology Consulted and appreciate Recc's -Will obtain Limited ECHO per Cards.   Acute on Chronic Renal Failure/stage III Chronic Kidney Disease -Baseline creatinine is around 1.5 with a GFR of around 36.  -BUN/Cr went to 47/2.43 -Will continue to Follow creatinine.   Coagulopathy/thrombocytopenia -Platelet Count went from 112 -> 106 -> 148 -> 152 -> 172 -> 143 -Patient's last INR was 1.70  -Patient lost blood from a skin tear on his right elbow, and he likely has GI bleeding as well. -Was given 2 units of fresh frozen plasma and a dose of vitamin K. -Continue to Monitor  CAD (coronary artery disease)  -Given reports of dark stools, and markedly low hemoglobin, avoid aspirin.  -Had midepigastric chest discomfort -Statins on hold secondary to elevated LFTs. -Cardiology Consult and Evaluation apprecated; Awaiting ECHO  Atrial fibrillation, chronic (HCC) -Blood thinners discontinued by admitting physician given elevated INR in the setting of alcohol abuse and high fall risk. -Concern for GI Bleeding so held. Per Cards Not an Anticoagulation Candidate Anymore. -Cardiology Evaluating and appreciate Recc's  Hypothyroidism -TSH markedly elevated at 19.74.; Free T4 1.17 -On Synthroid 50 g daily.    -Suspect non-compliance.  Hypokalemia -Patient's K+ was 3.3 -Replete -Repeat BMP in AM  COPD (chronic obstructive pulmonary disease) with possible Volume Overload -GAVE SOLUMEDROL 125 mg today -C/w Albuterol Nebs -Scheduled DuoNEbs TID -Flutter Valve as patient was slightly wheezing -IV LASIX ONCE  Alcohol abuse -Heavy Alcohol Drinker and increased drinking since January -Continue alcohol detox per CIWA protocol. -C/w MVI, Thiamine, and Folic Acid -PALLIATIVE CARE CONSULTATION Have Goals of Care Meeting at 2 pm tomorrow  DVT prophylaxis: SCDs as patient underwent EGD; No longer a Candidate for Anticoagulation per CARDS  Code Status: DNR Family Communication: Discussed case wife at bedside and daughter.  Disposition Plan: SNF when stable for D/C  Consultants:   Gastroenterology  General Surgery  Cardiology  Palliative Care Medicine  Procedures: EGD  Antimicrobials: IV Zosyn D/C'd Today; Switched to po Augmentin  Subjective: Seen and examined and he was looking paler. Patient stated he felt ok and a little better. Awaiting transfusion of blood. Denied any Cp. Denied any Abdominal Pain today. States he was mildly short of breath and "could hear the TV vibrating." No other concerns or complaints.    Objective: Vitals:   05/05/16 1153 05/05/16 1458 05/05/16 1705 05/05/16 1739  BP: 125/64 112/69 128/70 127/71  Pulse: (!) 105 (!) 121 (!) 107 69  Resp:  (!) 28 17 (!) 21  Temp: 97.5 F (36.4 C) 97.5 F (36.4 C) 97.6 F (36.4 C) 97.5 F (36.4 C)  TempSrc: Oral Axillary Axillary Axillary  SpO2: 100% 100% 100% 100%  Weight:      Height:        Intake/Output Summary (Last 24 hours) at 05/05/16 1841 Last data filed  at 05/05/16 1739  Gross per 24 hour  Intake           563.33 ml  Output              600 ml  Net           -36.67 ml   Filed Weights   04/28/16 1636 04/28/16 2334 05/02/16 0524  Weight: 74.8 kg (165 lb) 70.7 kg (155 lb 14.4 oz) 70.5 kg (155 lb 6.8  oz)    Examination: Physical Exam:  Constitutional: WN/WD, Mild respiratory Distress Eyes: Slight icteric scleara.  ENMT: External Ears, Nose appear normal. Grossly normal hearing. Mucous membranes slightly dry. Poor dentition.  Neck: Appears normal, supple, no cervical masses, normal ROM, no appreciable thyromegaly, no JVD Respiratory:  Diminished breath sounds with diffuse Rhonchi and Crackles., No Wheezing. Mild accessory muscle use.  Cardiovascular: Irregularly Irregular and tachycardic, no murmurs / rubs / gallops. S1 and S2 auscultated. Mild extremity edema. 2+ pedal pulses.  Abdomen: Soft, mid-epigastric tenderness, non-distended. No masses palpated. No appreciable hepatosplenomegaly. Bowel sounds positive x4.  GU: Deferred. Condom Cath in place with Dark Urine in foley bag.  Musculoskeletal: No clubbing / cyanosis of digits/nails. No joint deformity upper and lower extremities. No contractures. Decreased strength and muscle tone.  Skin: No rashes, lesions, ulcers. No induration; Warm and dry.  Neurologic: CN 2-12 grossly intact with no focal deficits. Sensation intact in all 4 Extremities. Romberg sign cerebellar reflexes not assessed.  Psychiatric:  Altered judgment and insight. Awake but not as alert. Normal mood and flat affect.   Data Reviewed: I have personally reviewed following labs and imaging studies  CBC:  Recent Labs Lab 05/01/16 0442 05/02/16 0622 05/03/16 0619 05/04/16 0606 05/05/16 0509  WBC 6.9 13.9* 12.3* 12.7* 11.2*  NEUTROABS 5.4 11.6* 9.7* 10.2* 8.7*  HGB 7.3* 7.6* 7.6* 7.5* 6.3*  HCT 21.4* 23.3* 23.2* 22.7* 18.9*  MCV 96.0 100.4* 100.0 100.0 101.1*  PLT 106* 148* 152 172 157   Basic Metabolic Panel:  Recent Labs Lab 05/01/16 0442 05/02/16 0622 05/03/16 0619 05/03/16 2314 05/04/16 0606 05/04/16 2042 05/05/16 0509  NA 133* 138 139  --  141  --  143  K 4.7 5.2* 4.2  --  3.7  --  3.3*  CL 102 106 108  --  107  --  109  CO2 24 21* 22  --  23   --  22  GLUCOSE 104* 121* 110*  --  126*  --  116*  BUN 20 20 23*  --  31*  --  47*  CREATININE 1.98* 2.02* 2.22*  --  2.34*  --  2.43*  CALCIUM 8.6* 8.9 8.9  --  8.8*  --  8.5*  MG 1.7 1.7 1.7  --  1.8  --  2.0  PHOS 2.6 2.9 3.4 4.9*  --  3.8  --    GFR: Estimated Creatinine Clearance: 21.9 mL/min (by C-G formula based on SCr of 2.43 mg/dL (H)). Liver Function Tests:  Recent Labs Lab 05/01/16 0442 05/02/16 0622 05/03/16 0619 05/04/16 0606 05/05/16 0509  AST 176* 161* 135* 119* 104*  ALT 93* 97* 90* 87* 77*  ALKPHOS 105 105 104 94 82  BILITOT 3.8* 3.8* 3.5* 3.0* 2.4*  PROT 5.4* 5.7* 5.6* 5.8* 5.2*  ALBUMIN 2.7* 2.9* 2.6* 2.5* 2.2*   No results for input(s): LIPASE, AMYLASE in the last 168 hours.  Recent Labs Lab 04/29/16 0340 05/01/16 1046  AMMONIA 59* 74*   Coagulation  Profile:  Recent Labs Lab 04/29/16 0340 04/30/16 0730 05/01/16 1046  INR 2.90 1.72 1.70   Cardiac Enzymes:  Recent Labs Lab 05/03/16 1820 05/03/16 2314 05/04/16 0606  TROPONINI 0.04* 0.03* 0.05*   BNP (last 3 results)  Recent Labs  01/23/16 1521  PROBNP 650.0*   HbA1C: No results for input(s): HGBA1C in the last 72 hours. CBG: No results for input(s): GLUCAP in the last 168 hours. Lipid Profile: No results for input(s): CHOL, HDL, LDLCALC, TRIG, CHOLHDL, LDLDIRECT in the last 72 hours. Thyroid Function Tests: No results for input(s): TSH, T4TOTAL, FREET4, T3FREE, THYROIDAB in the last 72 hours. Anemia Panel: No results for input(s): VITAMINB12, FOLATE, FERRITIN, TIBC, IRON, RETICCTPCT in the last 72 hours. Sepsis Labs: No results for input(s): PROCALCITON, LATICACIDVEN in the last 168 hours.  Recent Results (from the past 240 hour(s))  Gastrointestinal Panel by PCR , Stool     Status: None   Collection Time: 04/28/16 11:54 PM  Result Value Ref Range Status   Campylobacter species NOT DETECTED NOT DETECTED Final   Plesimonas shigelloides NOT DETECTED NOT DETECTED Final    Salmonella species NOT DETECTED NOT DETECTED Final   Yersinia enterocolitica NOT DETECTED NOT DETECTED Final   Vibrio species NOT DETECTED NOT DETECTED Final   Vibrio cholerae NOT DETECTED NOT DETECTED Final   Enteroaggregative E coli (EAEC) NOT DETECTED NOT DETECTED Final   Enteropathogenic E coli (EPEC) NOT DETECTED NOT DETECTED Final   Enterotoxigenic E coli (ETEC) NOT DETECTED NOT DETECTED Final   Shiga like toxin producing E coli (STEC) NOT DETECTED NOT DETECTED Final   Shigella/Enteroinvasive E coli (EIEC) NOT DETECTED NOT DETECTED Final   Cryptosporidium NOT DETECTED NOT DETECTED Final   Cyclospora cayetanensis NOT DETECTED NOT DETECTED Final   Entamoeba histolytica NOT DETECTED NOT DETECTED Final   Giardia lamblia NOT DETECTED NOT DETECTED Final   Adenovirus F40/41 NOT DETECTED NOT DETECTED Final   Astrovirus NOT DETECTED NOT DETECTED Final   Norovirus GI/GII NOT DETECTED NOT DETECTED Final   Rotavirus A NOT DETECTED NOT DETECTED Final   Sapovirus (I, II, IV, and V) NOT DETECTED NOT DETECTED Final  Culture, Urine     Status: Abnormal   Collection Time: 05/02/16  7:52 AM  Result Value Ref Range Status   Specimen Description URINE, RANDOM  Final   Special Requests NONE  Final   Culture >=100,000 COLONIES/mL ENTEROCOCCUS FAECALIS (A)  Final   Report Status 05/04/2016 FINAL  Final   Organism ID, Bacteria ENTEROCOCCUS FAECALIS (A)  Final      Susceptibility   Enterococcus faecalis - MIC*    AMPICILLIN <=2 SENSITIVE Sensitive     LEVOFLOXACIN 0.5 SENSITIVE Sensitive     NITROFURANTOIN <=16 SENSITIVE Sensitive     VANCOMYCIN 1 SENSITIVE Sensitive     * >=100,000 COLONIES/mL ENTEROCOCCUS FAECALIS  Culture, blood (Routine X 2) w Reflex to ID Panel     Status: None (Preliminary result)   Collection Time: 05/02/16  8:47 AM  Result Value Ref Range Status   Specimen Description BLOOD LEFT ARM  Final   Special Requests IN PEDIATRIC BOTTLE 1CC  Final   Culture NO GROWTH 3 DAYS  Final     Report Status PENDING  Incomplete  Culture, blood (Routine X 2) w Reflex to ID Panel     Status: None (Preliminary result)   Collection Time: 05/02/16  8:55 AM  Result Value Ref Range Status   Specimen Description BLOOD LEFT ANTECUBITAL  Final  Special Requests IN PEDIATRIC BOTTLE 1CC  Final   Culture NO GROWTH 3 DAYS  Final   Report Status PENDING  Incomplete     Radiology Studies: No results found. Scheduled Meds: . sodium chloride   Intravenous Once  . amoxicillin-clavulanate  1 tablet Oral Q12H  . carvedilol  12.5 mg Oral BID WC  . folic acid  1 mg Oral Daily  . ipratropium-albuterol  3 mL Nebulization TID  . levothyroxine  50 mcg Oral QAC breakfast  . mouth rinse  15 mL Mouth Rinse BID  . multivitamin with minerals  1 tablet Oral Daily  . pramipexole  0.25 mg Oral QHS  . tamsulosin  0.4 mg Oral Daily  . thiamine  100 mg Oral Daily   Continuous Infusions:    LOS: 7 days   Xavier Laughtermair Latif Sheikh, DO [facility] Pager [phone]  If 7PM-7AM, please contact night-coverage [email or web address] Password [email or web address] 05/05/2016, 6:41 PM

## 2016-05-05 NOTE — Progress Notes (Signed)
PT Cancellation Note  Patient Details Name: Xavier RuddyJackie O Calabria MRN: [id number] DOB: [birth year]   Cancelled Treatment:    Reason Eval/Treat Not Completed: Medical issues which prohibited therapy.  Pt with low Hgb and receiving first unit of blood. Will check back tomorrow.   Juanito Gonyer LUBECK 05/05/2016, 2:01 PM

## 2016-05-05 NOTE — Progress Notes (Signed)
Palliative Medicine consult noted. Due to high referral volume, there may be a delay seeing this patient. Please call the Palliative Medicine Team office at [phone] if recommendations are needed in the interim.  Thank you for inviting us to see this patient.  Kayle Passarelli G. Samhitha Rosen, RN, BSN, CHPN 05/05/2016 12:54 PM Cell [phone] 8:00-4:00 Monday-Friday Office [phone] 

## 2016-05-05 NOTE — Progress Notes (Signed)
Nutrition Follow-up  DOCUMENTATION CODES:   Not applicable  INTERVENTION:   -D/c Boost Breeze po TID, each supplement provides 250 kcal and 9 grams of protein -Consider swallow evaluation for safest diet -If pt unable to take adequate PO's, consider temporary alterative means of nutrition if aggressive care is desired. Recommend:  Initiate Osmolite 1.2 @ 20 ml/hr and increase by 10 ml every 12 hours to goal rate of 60 ml/hr.   Tube feeding regimen provides 1728 kcal (100% of needs), 80 grams of protein, and 1181 ml of H2O.   NUTRITION DIAGNOSIS:   Inadequate oral intake related to altered GI function as evidenced by per patient/family report.  Ongoing  GOAL:   Patient will meet greater than or equal to 90% of their needs  Unmet  MONITOR:   PO intake, Supplement acceptance, Diet advancement, Labs, Weight trends, Skin, I & O's  REASON FOR ASSESSMENT:   Consult Assessment of nutrition requirement/status  ASSESSMENT:   82 y.o. male with medical history significant of CAD sp CABG, A. Fib Alcohol abuse,  admitted for alcoholic liver failure, acute on chronic renal failure, hyponatremia, hypokalemia  Pt sleeping soundly at time of visit. Multiple family members at bedside. Palliative care consult pending.   Pt was advanced to a heart healthy diet on 05/03/16. Intake remains poor; PO: 0-10%. Per RN notes, pt has been refusing supplements and is unable to swallow currently. Pt has been eating poorly throughout hospitalization. If pt is unable to swallow or not alert enough to take PO's, pt may benefit from temporary alternative means of nutrition if aggressive care is desired.   Labs reviewed: K: 3.3.   Diet Order:  Diet Heart Room service appropriate? Yes; Fluid consistency: Thin  Skin:  Reviewed, no issues  Last BM:  05/05/16  Height:   Ht Readings from Last 1 Encounters:  04/28/16 5\' 8"  (1.727 m)    Weight:   Wt Readings from Last 1 Encounters:  05/02/16 155 lb  6.8 oz (70.5 kg)    Ideal Body Weight:  70 kg  BMI:  Body mass index is 23.63 kg/m.  Estimated Nutritional Needs:   Kcal:  1700-1900  Protein:  80-95 grams  Fluid:  1.7-1.9 L  EDUCATION NEEDS:   Education needs addressed  Alanda Colton A. Mayford KnifeWilliams, RD, LDN, CDE Pager: [phone] After hours Pager: [phone]

## 2016-05-06 ENCOUNTER — Inpatient Hospital Stay (HOSPITAL_COMMUNITY): Payer: Medicare Other

## 2016-05-06 DIAGNOSIS — N179 Acute kidney failure, unspecified: Secondary | ICD-10-CM

## 2016-05-06 DIAGNOSIS — R0602 Shortness of breath: Secondary | ICD-10-CM

## 2016-05-06 DIAGNOSIS — Z515 Encounter for palliative care: Secondary | ICD-10-CM

## 2016-05-06 DIAGNOSIS — R791 Abnormal coagulation profile: Secondary | ICD-10-CM

## 2016-05-06 DIAGNOSIS — R079 Chest pain, unspecified: Secondary | ICD-10-CM

## 2016-05-06 DIAGNOSIS — R945 Abnormal results of liver function studies: Secondary | ICD-10-CM

## 2016-05-06 DIAGNOSIS — Z7189 Other specified counseling: Secondary | ICD-10-CM

## 2016-05-06 DIAGNOSIS — R7989 Other specified abnormal findings of blood chemistry: Secondary | ICD-10-CM

## 2016-05-06 DIAGNOSIS — R932 Abnormal findings on diagnostic imaging of liver and biliary tract: Secondary | ICD-10-CM

## 2016-05-06 DIAGNOSIS — N189 Chronic kidney disease, unspecified: Secondary | ICD-10-CM

## 2016-05-06 DIAGNOSIS — F05 Delirium due to known physiological condition: Secondary | ICD-10-CM

## 2016-05-06 LAB — CBC WITH DIFFERENTIAL/PLATELET
BASOS ABS: 0 10*3/uL (ref 0.0–0.1)
Basophils Relative: 0 %
EOS ABS: 0 10*3/uL (ref 0.0–0.7)
Eosinophils Relative: 0 %
HCT: 22.1 % — ABNORMAL LOW (ref 39.0–52.0)
Hemoglobin: 7.5 g/dL — ABNORMAL LOW (ref 13.0–17.0)
LYMPHS PCT: 18 %
Lymphs Abs: 2.7 10*3/uL (ref 0.7–4.0)
MCH: 31.1 pg (ref 26.0–34.0)
MCHC: 33.9 g/dL (ref 30.0–36.0)
MCV: 91.7 fL (ref 78.0–100.0)
Monocytes Absolute: 0.4 10*3/uL (ref 0.1–1.0)
Monocytes Relative: 3 %
NEUTROS ABS: 11.7 10*3/uL — AB (ref 1.7–7.7)
NEUTROS PCT: 79 %
PLATELETS: 193 10*3/uL (ref 150–400)
RBC: 2.41 MIL/uL — AB (ref 4.22–5.81)
RDW: 21.7 % — ABNORMAL HIGH (ref 11.5–15.5)
WBC: 14.8 10*3/uL — AB (ref 4.0–10.5)

## 2016-05-06 LAB — ECHOCARDIOGRAM LIMITED
FS: 24 % — AB (ref 28–44)
HEIGHTINCHES: 68 in
IVS/LV PW RATIO, ED: 0.9
LA ID, A-P, ES: 46 mm
LA diam end sys: 46 mm
LADIAMINDEX: 2.49 cm/m2
LDCA: 3.14 cm2
LV PW d: 12.3 mm — AB (ref 0.6–1.1)
LVOTD: 20 mm
WEIGHTICAEL: 2486.79 [oz_av]

## 2016-05-06 LAB — COMPREHENSIVE METABOLIC PANEL
ALBUMIN: 2.2 g/dL — AB (ref 3.5–5.0)
ALT: 83 U/L — AB (ref 17–63)
ANION GAP: 10 (ref 5–15)
AST: 121 U/L — ABNORMAL HIGH (ref 15–41)
Alkaline Phosphatase: 80 U/L (ref 38–126)
BUN: 77 mg/dL — ABNORMAL HIGH (ref 6–20)
CHLORIDE: 113 mmol/L — AB (ref 101–111)
CO2: 21 mmol/L — AB (ref 22–32)
CREATININE: 2.68 mg/dL — AB (ref 0.61–1.24)
Calcium: 8.6 mg/dL — ABNORMAL LOW (ref 8.9–10.3)
GFR calc non Af Amer: 20 mL/min — ABNORMAL LOW (ref >60)
GFR, EST AFRICAN AMERICAN: 24 mL/min — AB (ref >60)
Glucose, Bld: 125 mg/dL — ABNORMAL HIGH (ref 65–99)
Potassium: 3.8 mmol/L (ref 3.5–5.1)
SODIUM: 144 mmol/L (ref 135–145)
Total Bilirubin: 2.7 mg/dL — ABNORMAL HIGH (ref 0.3–1.2)
Total Protein: 5.3 g/dL — ABNORMAL LOW (ref 6.5–8.1)

## 2016-05-06 LAB — HEMOGLOBIN AND HEMATOCRIT, BLOOD
HEMATOCRIT: 23.8 % — AB (ref 39.0–52.0)
Hemoglobin: 8 g/dL — ABNORMAL LOW (ref 13.0–17.0)

## 2016-05-06 LAB — TYPE AND SCREEN
ABO/RH(D): A POS
Antibody Screen: NEGATIVE
Unit division: 0
Unit division: 0

## 2016-05-06 LAB — PHOSPHORUS: Phosphorus: 2.8 mg/dL (ref 2.5–4.6)

## 2016-05-06 LAB — FECAL LACTOFERRIN, QUANT: Lactoferrin, Fecal, Quant.: <1 ug/mL(g) (ref 0.00–7.24)

## 2016-05-06 LAB — MAGNESIUM: Magnesium: 2.1 mg/dL (ref 1.7–2.4)

## 2016-05-06 MED ORDER — MORPHINE SULFATE (CONCENTRATE) 10 MG/0.5ML PO SOLN: 5.0000 mg | ORAL | Status: DC | PRN

## 2016-05-06 MED ORDER — GLYCOPYRROLATE 0.2 MG/ML IJ SOLN: 0.2000 mg | INTRAMUSCULAR | Status: DC | PRN

## 2016-05-06 MED ORDER — LORAZEPAM 2 MG/ML PO CONC: 1.0000 mg | ORAL | Status: DC | PRN

## 2016-05-06 MED ORDER — METOPROLOL TARTRATE 5 MG/5ML IV SOLN: 5.0000 mg | Freq: Four times a day (QID) | INTRAVENOUS | Status: DC | PRN

## 2016-05-06 MED ORDER — HALOPERIDOL LACTATE 2 MG/ML PO CONC: 0.5000 mg | ORAL | Status: DC | PRN

## 2016-05-06 MED ORDER — POLYVINYL ALCOHOL 1.4 % OP SOLN
1.0000 [drp] | Freq: Four times a day (QID) | OPHTHALMIC | Status: DC | PRN
  Filled 2016-05-06: qty 15

## 2016-05-06 MED ORDER — MORPHINE SULFATE (CONCENTRATE) 10 MG/0.5ML PO SOLN
5.0000 mg | ORAL | Status: DC | PRN
  Administered 2016-05-07: 5 mg via ORAL
  Filled 2016-05-06: qty 0.5

## 2016-05-06 MED ORDER — DIPHENHYDRAMINE HCL 50 MG/ML IJ SOLN: 12.5000 mg | INTRAMUSCULAR | Status: DC | PRN

## 2016-05-06 MED ORDER — SODIUM CHLORIDE 0.9% FLUSH: 3.0000 mL | INTRAVENOUS | Status: DC | PRN

## 2016-05-06 MED ORDER — GLYCOPYRROLATE 1 MG PO TABS
1.0000 mg | ORAL_TABLET | ORAL | Status: DC | PRN
Start: 2016-05-06 — End: 2016-05-07

## 2016-05-06 MED ORDER — ACETAMINOPHEN 325 MG PO TABS: 650.0000 mg | ORAL_TABLET | Freq: Four times a day (QID) | ORAL | Status: DC | PRN

## 2016-05-06 MED ORDER — HALOPERIDOL 0.5 MG PO TABS
0.5000 mg | ORAL_TABLET | ORAL | Status: DC | PRN
  Filled 2016-05-06: qty 1

## 2016-05-06 MED ORDER — ONDANSETRON 4 MG PO TBDP: 4.0000 mg | ORAL_TABLET | Freq: Four times a day (QID) | ORAL | Status: DC | PRN

## 2016-05-06 MED ORDER — LORAZEPAM 2 MG/ML IJ SOLN: 1.0000 mg | INTRAMUSCULAR | Status: DC | PRN

## 2016-05-06 MED ORDER — SODIUM CHLORIDE 0.9% FLUSH
3.0000 mL | Freq: Two times a day (BID) | INTRAVENOUS | Status: DC
  Administered 2016-05-06 – 2016-05-07 (×2): 3 mL via INTRAVENOUS

## 2016-05-06 MED ORDER — BIOTENE DRY MOUTH MT LIQD: 15.0000 mL | OROMUCOSAL | Status: DC | PRN

## 2016-05-06 MED ORDER — ONDANSETRON HCL 4 MG/2ML IJ SOLN: 4.0000 mg | Freq: Four times a day (QID) | INTRAMUSCULAR | Status: DC | PRN

## 2016-05-06 MED ORDER — ACETAMINOPHEN 650 MG RE SUPP: 650.0000 mg | Freq: Four times a day (QID) | RECTAL | Status: DC | PRN

## 2016-05-06 MED ORDER — SODIUM CHLORIDE 0.9 % IV SOLN: 250.0000 mL | INTRAVENOUS | Status: DC | PRN

## 2016-05-06 MED ORDER — RESOURCE THICKENUP CLEAR PO POWD
ORAL | Status: DC | PRN
Start: 2016-05-06 — End: 2016-05-07
  Administered 2016-05-06: 15:00:00 via ORAL
  Filled 2016-05-06: qty 125

## 2016-05-06 MED ORDER — LORAZEPAM 1 MG PO TABS: 1.0000 mg | ORAL_TABLET | ORAL | Status: DC | PRN

## 2016-05-06 MED ORDER — TRAZODONE HCL 50 MG PO TABS: 25.0000 mg | ORAL_TABLET | Freq: Every evening | ORAL | Status: DC | PRN

## 2016-05-06 MED ORDER — HALOPERIDOL LACTATE 5 MG/ML IJ SOLN: 0.5000 mg | INTRAMUSCULAR | Status: DC | PRN

## 2016-05-06 NOTE — Progress Notes (Signed)
PROGRESS NOTE    Xavier Taylor  NFA:[id number] DOB: 02/12/1930 DOA: 04/28/2016 PCP: Ginette Otto, MD  Brief Narrative: Xavier Taylor is an 82 y.o. male with a PMH of CAD status post CABG, chronic atrial fibrillation on blood thinners and Heavily Alcohol Abuse who was admitted 04/28/16 with a chief complaint of confusion and weakness. He underwent an EGD 04/30/2016. He is now FOBT Positive. Patient spiked a white count and had imaging showing contracted gallbladder so General Surgery was consulted. Imaging revealed no Acute Cholecystitis, however patient did have a Urine Culture come back with E. Faecalis. Patient complained of midepigastric pain later in the day so an EKG was done and troponins were mildly elevated. Cardiology was consulted. After discussion with son Palliative Care was also consulted because patient has significant Drinking problem. A family meeting took place 05/06/16 and a decision was made to make the patient Comfort Care with a Residential Hospice Evaluation.   Assessment & Plan:   Principal Problem:   Alcoholic liver disease (HCC) Active Problems:   CAD (coronary artery disease)   Atrial fibrillation, chronic (HCC)   Anemia   Hyponatremia   Hypokalemia   Hypomagnesemia   COPD (chronic obstructive pulmonary disease) (HCC)   Alcohol abuse   Acute liver failure   Coagulopathy (HCC)   Thrombocytopenia (HCC)   Hypothyroidism   Acute renal failure superimposed on stage 3 chronic kidney disease (HCC)   Acute confusional state   Terminal care   Palliative care by specialist   Advance care planning  Anemia of chronic blood loss -S/p Transfusion of 3 unit of pRBC -Hb/Hct today was 7.5/22.1 after transfusion of 2 units; Patient FOBT Positive -EGD done showed There is no endoscopic evidence of Barrett's esophagus, esophagitis, inflammation, stricture, ulcerations or varices in the entire esophagus. The stomach was normal. The examined duodenum was normal  and there was no etiology evident for anemia on Endoscopic Evaluation -Will discontinue all Blood Draws as goal of care is now Comfort  Leukocytosis likely 2/2 to Enterococcus Faecalis UTI -WBC went from 6 -> 13.9 -> 12.3 -> 12.7 -> 11.2 -> 14.8 -Blood Cx Negative x 3 days.  -Urine Cx showed >100,000 of E Faecalis -CXR ordered and showed Cardiomegaly with moderate pulmonary vascular congestion bilateral pleural effusions compatible with congestive heart failure. Mild bibasilar airspace disease likely reflects atelectasis. Infection is not excluded. -CT of Abdomen and Pelvis showed Small right pleural effusion, new since prior study. Bibasilar atelectasis. Diffuse fatty infiltration of the liver. Continued ill-defined low-density area within the liver in the left hepatic lobe, which cannot be characterized without intravenous contrast. May consider further evaluation with MRI.  Continued mildly contracted gallbladder with stranding in the pericholecystic fat.  -Ordered U/S of GallBladder which did not reveal Acute Cholecystitis -Consulted General Surgery prior to U/S report and appreciated their Recc's -Will discontinue Antibiotics as goal of care is now Comfort  Alcoholic liver disease (HCC)/Acute liver failure with confusion and weakness -The patient's elevated LFTs, low protein/albumin, thrombocytopenia and elevated ammonia levels (now 74) suggest fairly advanced alcohol-induced liver diseases -CT of the abdomen showed fatty infiltration of the liver. Ill-defined hypodensity in the right lobe of the liver noted, neoplastic process could not be excluded.  -Ultrasound showed fatty liver and a complex cyst in the right hepatic lobe measuring 1.9 cm with recommendations for MRI for further correlation.  -MRI of Abdomen with and without Contrast ordered to Evaluate Hepatic Lobe Lesion and showed lesion in the central LEFT hepatic lobe  has imaging characteristics most consistent with benign cyst.  Hepatic steatosis.  No biliary obstruction.  Small amount of fluid along the second portion duodenum and mild pericholecystic fluid. Differential would include duodenitis, pancreatitis, or chronic cholecystitis. Pancreas does not appear inflamed. -GI consulted to assist with further workup/management and signed off. -PALLIATIVE CARE TO EVALUATED AND Patient is now COMFORT CARE  Chest Discomfort r/o ACS -Troponins went from 0.04 -> 0.03 -> 0.05 -EKG did not change however could have had rate related T wave inversions. Discussed with Cardiology Dr. Garnette Scheuermann -GI Cocktail Given [facility] Cardiology Consulted and appreciate Recc's -Will obtain Limited ECHO per Cards.  -Will Provide Symptom Management with Morphine for Pain/SOB and Lorazepam for Anxiety  Acute on Chronic Renal Failure/stage III Chronic Kidney Disease -Baseline creatinine is around 1.5 with a GFR of around 36.  -BUN/Cr went from 47/2.43 -> 77/2.68 -Will discontinue all Blood Draws as goal of care is now Comfort  Coagulopathy/thrombocytopenia -Platelet Count went from 112 -> 106 -> 148 -> 152 -> 172 -> 143 -> 193 -Patient's last INR was 1.70  -Patient lost blood from a skin tear on his right elbow, and he likely has GI bleeding as well. -Was given 2 units of fresh frozen plasma and a dose of vitamin K. -Will discontinue all Blood Draws as goal of care is now Comfort  CAD (coronary artery disease)  -Given reports of dark stools, and markedly low hemoglobin, avoid aspirin.  -Had midepigastric chest discomfort -Statins on hold secondary to elevated LFTs. -Cardiology Consulted and Evaluation apprecated;  -ECHO showed:  Left ventricle: The cavity size was normal. Wall thickness was   increased in a pattern of mild LVH. Systolic function was   vigorous. The estimated ejection fraction was in the range of 65%   to 70%. Wall motion was normal; there were no regional wall   motion abnormalities. - Aortic valve: Trileaflet; mildly  thickened, mildly calcified   leaflets. - Mitral valve: Calcified annulus. There was mild regurgitation. - Left atrium: The atrium was moderately dilated.   Anterior-posterior dimension: 46 mm. - Right ventricle: The cavity size was moderately dilated. Wall   thickness was normal. Systolic function was moderately reduced. - Right atrium: The atrium was moderately dilated. - Tricuspid valve: There was moderate regurgitation. Will Provide Symptom Management with Morphine for Pain/SOB and Lorazepam for Anxiety  Atrial fibrillation, chronic (HCC) -Blood thinners discontinued by admitting physician given elevated INR in the setting of alcohol abuse and high fall risk. -Concern for GI Bleeding so held. Per Cards Not an Anticoagulation Candidate Anymore. -Cardiology Evaluating and appreciate Recc's; Will Discontinue Blood Draws and Provide Symptom Management with Morphine for Pain/SOB and Lorazepam for Anxiety  Hypothyroidism -TSH markedly elevated at 19.74.; Free T4 1.17 -On Synthroid 50 g daily.  -Suspect non-compliance.  Hypokalemia, improved -Patient's K+ was 3.8 Will Provide Symptom Management with Morphine for Pain/SOB and Lorazepam for Anxiety  COPD (chronic obstructive pulmonary disease) with possible Volume Overload -GAVE SOLUMEDROL 125 mg today -C/w Albuterol Nebs -Scheduled DuoNEbs TID -Will Provide Symptom Management with Morphine for Pain/SOB and Lorazepam for Anxiety  Alcohol abuse -Heavy Alcohol Drinker and increased drinking since January -Continue alcohol detox per CIWA protocol. -C/w MVI, Thiamine, and Folic Acid -PATIENT IS COMFORT CARE with Likely Hospice  DVT prophylaxis: SCDs as patient underwent EGD; No longer a Candidate for Anticoagulation per CARDS  Code Status: DNR - HOSPICE and COMFORT CARE Family Communication: Discussed case with family at Bedside Disposition Plan: Evaluation for Residential  Hospice. Possible [facility].   Consultants:    Gastroenterology  General Surgery  Cardiology  Palliative Care Medicine  Procedures: EGD  Antimicrobials: IV Zosyn D/C'd yesterday; Switched to po Augmentin  Subjective: Seen and examined and he stated he was "deprived." Patient's family at bedside and had a conversation with Palliative Care team and goal has changed to comfort now.  All aggressive measures will be stopped and all blood draws. Antibiotics will be Discontinued and patient will have Haloperidol for Agitation, Morphine for Pain/SOB, and Lorazepam for Anxiety. Family understands have come to a consensus to make him comfortable.    Objective: Vitals:   05/06/16 0700 05/06/16 0753 05/06/16 1030 05/06/16 1333  BP: 129/70  109/70   Pulse: 77  86   Resp:   18   Temp:   97.4 F (36.3 C)   TempSrc:   Oral   SpO2:  99% 90% 100%  Weight:      Height:        Intake/Output Summary (Last 24 hours) at 05/06/16 1542 Last data filed at 05/06/16 1245  Gross per 24 hour  Intake              365 ml  Output              750 ml  Net             -385 ml   Filed Weights   04/28/16 1636 04/28/16 2334 05/02/16 0524  Weight: 74.8 kg (165 lb) 70.7 kg (155 lb 14.4 oz) 70.5 kg (155 lb 6.8 oz)    Examination: Physical Exam: Constitutional: WN/WD, Mild respiratory Distress Eyes: Slight icteric scleara.  ENMT: External Ears, Nose appear normal. Grossly normal hearing. Mucous membranes slightly dry. Poor dentition.  Neck: Appears normal, supple, no cervical masses, normal ROM, no appreciable thyromegaly, no JVD Respiratory:  Diminished breath sounds but no Wheezing, Rales, or Rhonchi. Mild accessory muscle use.  Cardiovascular: Irregularly Irregular and tachycardic, no murmurs / rubs / gallops. S1 and S2 auscultated. No extremity edema. 2+ pedal pulses.  Abdomen: Soft, mid-epigastric tenderness, non-distended. No masses palpated. No appreciable hepatosplenomegaly. Bowel sounds positive x4.  GU: Deferred. Condom Cath in place with  minimal urine in foley bag.  Musculoskeletal: No clubbing / cyanosis of digits/nails. No joint deformity upper and lower extremities. No contractures. Decreased strength and muscle tone.  Skin: No rashes, lesions, ulcers. No induration; Warm and dry.  Neurologic: CN 2-12 grossly intact with no focal deficits. Sensation intact in all 4 Extremities. Romberg sign cerebellar reflexes not assessed.  Psychiatric:  Altered judgment and insight. Awake but not oriented. Normal mood and flat affect.   Data Reviewed: I have personally reviewed following labs and imaging studies  CBC:  Recent Labs Lab 05/02/16 0622 05/03/16 0619 05/04/16 0606 05/05/16 0509 05/06/16 0026 05/06/16 0418  WBC 13.9* 12.3* 12.7* 11.2*  --  14.8*  NEUTROABS 11.6* 9.7* 10.2* 8.7*  --  11.7*  HGB 7.6* 7.6* 7.5* 6.3* 8.0* 7.5*  HCT 23.3* 23.2* 22.7* 18.9* 23.8* 22.1*  MCV 100.4* 100.0 100.0 101.1*  --  91.7  PLT 148* 152 172 157  --  193   Basic Metabolic Panel:  Recent Labs Lab 05/02/16 0622 05/03/16 0619 05/03/16 2314 05/04/16 0606 05/04/16 2042 05/05/16 0509 05/06/16 0418  NA 138 139  --  141  --  143 144  K 5.2* 4.2  --  3.7  --  3.3* 3.8  CL 106 108  --  107  --  109 113*  CO2 21* 22  --  23  --  22 21*  GLUCOSE 121* 110*  --  126*  --  116* 125*  BUN 20 23*  --  31*  --  47* 77*  CREATININE 2.02* 2.22*  --  2.34*  --  2.43* 2.68*  CALCIUM 8.9 8.9  --  8.8*  --  8.5* 8.6*  MG 1.7 1.7  --  1.8  --  2.0 2.1  PHOS 2.9 3.4 4.9*  --  3.8  --  2.8   GFR: Estimated Creatinine Clearance: 19.9 mL/min (by C-G formula based on SCr of 2.68 mg/dL (H)). Liver Function Tests:  Recent Labs Lab 05/02/16 0622 05/03/16 0619 05/04/16 0606 05/05/16 0509 05/06/16 0418  AST 161* 135* 119* 104* 121*  ALT 97* 90* 87* 77* 83*  ALKPHOS 105 104 94 82 80  BILITOT 3.8* 3.5* 3.0* 2.4* 2.7*  PROT 5.7* 5.6* 5.8* 5.2* 5.3*  ALBUMIN 2.9* 2.6* 2.5* 2.2* 2.2*   No results for input(s): LIPASE, AMYLASE in the last 168  hours.  Recent Labs Lab 05/01/16 1046  AMMONIA 74*   Coagulation Profile:  Recent Labs Lab 04/30/16 0730 05/01/16 1046  INR 1.72 1.70   Cardiac Enzymes:  Recent Labs Lab 05/03/16 1820 05/03/16 2314 05/04/16 0606  TROPONINI 0.04* 0.03* 0.05*   BNP (last 3 results)  Recent Labs  01/23/16 1521  PROBNP 650.0*   HbA1C: No results for input(s): HGBA1C in the last 72 hours. CBG: No results for input(s): GLUCAP in the last 168 hours. Lipid Profile: No results for input(s): CHOL, HDL, LDLCALC, TRIG, CHOLHDL, LDLDIRECT in the last 72 hours. Thyroid Function Tests: No results for input(s): TSH, T4TOTAL, FREET4, T3FREE, THYROIDAB in the last 72 hours. Anemia Panel: No results for input(s): VITAMINB12, FOLATE, FERRITIN, TIBC, IRON, RETICCTPCT in the last 72 hours. Sepsis Labs: No results for input(s): PROCALCITON, LATICACIDVEN in the last 168 hours.  Recent Results (from the past 240 hour(s))  Gastrointestinal Panel by PCR , Stool     Status: None   Collection Time: 04/28/16 11:54 PM  Result Value Ref Range Status   Campylobacter species NOT DETECTED NOT DETECTED Final   Plesimonas shigelloides NOT DETECTED NOT DETECTED Final   Salmonella species NOT DETECTED NOT DETECTED Final   Yersinia enterocolitica NOT DETECTED NOT DETECTED Final   Vibrio species NOT DETECTED NOT DETECTED Final   Vibrio cholerae NOT DETECTED NOT DETECTED Final   Enteroaggregative E coli (EAEC) NOT DETECTED NOT DETECTED Final   Enteropathogenic E coli (EPEC) NOT DETECTED NOT DETECTED Final   Enterotoxigenic E coli (ETEC) NOT DETECTED NOT DETECTED Final   Shiga like toxin producing E coli (STEC) NOT DETECTED NOT DETECTED Final   Shigella/Enteroinvasive E coli (EIEC) NOT DETECTED NOT DETECTED Final   Cryptosporidium NOT DETECTED NOT DETECTED Final   Cyclospora cayetanensis NOT DETECTED NOT DETECTED Final   Entamoeba histolytica NOT DETECTED NOT DETECTED Final   Giardia lamblia NOT DETECTED NOT  DETECTED Final   Adenovirus F40/41 NOT DETECTED NOT DETECTED Final   Astrovirus NOT DETECTED NOT DETECTED Final   Norovirus GI/GII NOT DETECTED NOT DETECTED Final   Rotavirus A NOT DETECTED NOT DETECTED Final   Sapovirus (I, II, IV, and V) NOT DETECTED NOT DETECTED Final  Culture, Urine     Status: Abnormal   Collection Time: 05/02/16  7:52 AM  Result Value Ref Range Status   Specimen Description URINE, RANDOM  Final   Special Requests NONE  Final  Culture >=100,000 COLONIES/mL ENTEROCOCCUS FAECALIS (A)  Final   Report Status 05/04/2016 FINAL  Final   Organism ID, Bacteria ENTEROCOCCUS FAECALIS (A)  Final      Susceptibility   Enterococcus faecalis - MIC*    AMPICILLIN <=2 SENSITIVE Sensitive     LEVOFLOXACIN 0.5 SENSITIVE Sensitive     NITROFURANTOIN <=16 SENSITIVE Sensitive     VANCOMYCIN 1 SENSITIVE Sensitive     * >=100,000 COLONIES/mL ENTEROCOCCUS FAECALIS  Culture, blood (Routine X 2) w Reflex to ID Panel     Status: None (Preliminary result)   Collection Time: 05/02/16  8:47 AM  Result Value Ref Range Status   Specimen Description BLOOD LEFT ARM  Final   Special Requests IN PEDIATRIC BOTTLE 1CC  Final   Culture NO GROWTH 4 DAYS  Final   Report Status PENDING  Incomplete  Culture, blood (Routine X 2) w Reflex to ID Panel     Status: None (Preliminary result)   Collection Time: 05/02/16  8:55 AM  Result Value Ref Range Status   Specimen Description BLOOD LEFT ANTECUBITAL  Final   Special Requests IN PEDIATRIC BOTTLE 1CC  Final   Culture NO GROWTH 4 DAYS  Final   Report Status PENDING  Incomplete     Radiology Studies: Dg Chest Port 1 View  Result Date: 05/06/2016 CLINICAL DATA:  Shortness of breath, hypertension EXAM: PORTABLE CHEST 1 VIEW COMPARISON:  05/02/2016 FINDINGS: Cardiomegaly with vascular congestion. Prior CABG. Bibasilar atelectasis. No overt edema. Suspect small effusions, similar to prior study. No acute bony abnormality. IMPRESSION: Cardiomegaly with  vascular congestion and bibasilar atelectasis. Suspect small effusions. Electronically Signed   By: Charlett Nose M.D.   On: 05/06/2016 08:41   Dg Swallowing Func-speech Pathology  Result Date: 05/06/2016 Objective Swallowing Evaluation: Type of Study: MBS-Modified Barium Swallow Study Patient Details Name: Xavier Taylor MRN: [id number] Date of Birth: 06/15/1930 Today's Date: 05/06/2016 Time: SLP Start Time (ACUTE ONLY): 1420-SLP Stop Time (ACUTE ONLY): 1436 SLP Time Calculation (min) (ACUTE ONLY): 16 min Past Medical History: Past Medical History: Diagnosis Date . A-fib (HCC)  . Acute liver failure 04/28/2016 . Alcoholic liver disease (HCC) 04/28/2016 . Ascites  . BPH (benign prostatic hypertrophy)  . CAD (coronary artery disease)  . Chronic renal disease, stage III  . COPD (chronic obstructive pulmonary disease) (HCC)  . Coronary artery disease  . Decreased GFR  . GERD with stricture  . Hypertension  . Hypothyroid  . Internal hemorrhoids  . Renal disorder  . S/P CABG x 5  . Thrombocytopenia (HCC) 04/29/2016 Past Surgical History: Past Surgical History: Procedure Laterality Date . APPENDECTOMY   . BACK SURGERY   . CARDIAC SURGERY   . ESOPHAGOGASTRODUODENOSCOPY N/A 04/30/2016  Procedure: ESOPHAGOGASTRODUODENOSCOPY (EGD);  Surgeon: Carman Ching, MD;  Location: [facility] ENDOSCOPY;  Service: Endoscopy;  Laterality: N/A; HPI: Cheston Coury Rowlandis an 82 y.o.malewith a PMH of CAD status post CABG, chronic atrial fibrillation on blood thinners and Heavily Alcohol Abuse who was admitted 04/28/16 with a chief complaint of confusion and weakness. He underwent an EGD 04/30/2016. Patient spiked a white count and had imaging showing contracted gallbladder so General Surgery was consulted. Imaging revealed no Acute Cholecystitis, however patient did have a Urine Culture come back with E. Faecalis. Patient complained of midepigastric pain later in the day so an EKG was done and troponins were mildly elevated. Cardiology was  consulted. After discussion with son Palliative Care was also consulted because patient has significant Drinking problem. EGD done showed  There is no endoscopic evidence of Barrett's esophagus, esophagitis, inflammation, stricture, ulcerations or varices in the entire esophagus.The stomach was normal.CXR ordered and showed Cardiomegaly with moderate pulmonary vascular congestion bilateral pleural effusions compatible with congestive heart failure. Mild bibasilar airspace disease likely reflects atelectasis. Infection is not excluded. No Data Recorded Assessment / Plan / Recommendation CHL IP CLINICAL IMPRESSIONS 05/06/2016 Therapy Diagnosis Moderate pharyngeal phase dysphagia Clinical Impression Pt demonstrates a mild oral dysphagia including brief oral holding with most boluses, improved with verbal cues to swallow. The pharyngeal phase is characterized by decreased laryngeal closure with silent penetration and aspiration with thin liquids and mild pooling in the pyriform sinuses. The pt does not sense penetraiton or trace aspriation, but as aspirate builds he typically responds wth a cough which is not reliabley effective to expel aspirate. With thicker textures, nectar and purees, the bolus passes without incident. Esophageal sweep revealed appearance of stasis with no persitalsis (no radiologist to confirm). Pt is recommended to consume a dys 3 (mechanical soft) diet with nectar thick liquids. SLP will f/u for tolerance and education with family.  Impact on safety and function Moderate aspiration risk;Risk for inadequate nutrition/hydration   CHL IP TREATMENT RECOMMENDATION 05/06/2016 Treatment Recommendations Therapy as outlined in treatment plan below   Prognosis 05/06/2016 Prognosis for Safe Diet Advancement Fair Barriers to Reach Goals -- Barriers/Prognosis Comment -- CHL IP DIET RECOMMENDATION 05/06/2016 SLP Diet Recommendations Dysphagia 3 (Mech soft) solids;Nectar thick liquid Liquid Administration via  Cup;Straw Medication Administration Crushed with puree Compensations Slow rate;Small sips/bites Postural Changes Seated upright at 90 degrees;Remain semi-upright after after feeds/meals (Comment)   CHL IP OTHER RECOMMENDATIONS 05/06/2016 Recommended Consults -- Oral Care Recommendations Oral care BID Other Recommendations --   CHL IP FOLLOW UP RECOMMENDATIONS 05/06/2016 Follow up Recommendations Skilled Nursing facility   [facility] IP FREQUENCY AND DURATION 05/06/2016 Speech Therapy Frequency (ACUTE ONLY) min 2x/week Treatment Duration 2 weeks      CHL IP ORAL PHASE 05/06/2016 Oral Phase Impaired Oral - Pudding Teaspoon -- Oral - Pudding Cup -- Oral - Honey Teaspoon -- Oral - Honey Cup -- Oral - Nectar Teaspoon -- Oral - Nectar Cup Delayed oral transit;Holding of bolus Oral - Nectar Straw Delayed oral transit;Holding of bolus Oral - Thin Teaspoon -- Oral - Thin Cup Delayed oral transit;Holding of bolus Oral - Thin Straw -- Oral - Puree Delayed oral transit;Holding of bolus Oral - Mech Soft Delayed oral transit;Holding of bolus Oral - Regular -- Oral - Multi-Consistency -- Oral - Pill -- Oral Phase - Comment --  CHL IP PHARYNGEAL PHASE 05/06/2016 Pharyngeal Phase Impaired Pharyngeal- Pudding Teaspoon -- Pharyngeal -- Pharyngeal- Pudding Cup -- Pharyngeal -- Pharyngeal- Honey Teaspoon -- Pharyngeal -- Pharyngeal- Honey Cup -- Pharyngeal -- Pharyngeal- Nectar Teaspoon -- Pharyngeal -- Pharyngeal- Nectar Cup WFL Pharyngeal -- Pharyngeal- Nectar Straw WFL Pharyngeal -- Pharyngeal- Thin Teaspoon -- Pharyngeal -- Pharyngeal- Thin Cup Penetration/Aspiration before swallow;Penetration/Aspiration during swallow;Compensatory strategies attempted (with notebox) Pharyngeal Material enters airway, CONTACTS cords and not ejected out;Material enters airway, passes BELOW cords without attempt by patient to eject out (silent aspiration) Pharyngeal- Thin Straw Penetration/Aspiration before swallow;Penetration/Aspiration during  swallow;Compensatory strategies attempted (with notebox);Pharyngeal residue - pyriform Pharyngeal Material enters airway, passes BELOW cords without attempt by patient to eject out (silent aspiration) Pharyngeal- Puree WFL Pharyngeal -- Pharyngeal- Mechanical Soft WFL Pharyngeal -- Pharyngeal- Regular -- Pharyngeal -- Pharyngeal- Multi-consistency -- Pharyngeal -- Pharyngeal- Pill -- Pharyngeal -- Pharyngeal Comment --  No flowsheet data found. No flowsheet data found. Kendal Hymen  DeBlois, MA CCC-SLP [phone] DeBlois, Riley NearingBonnie Caroline 05/06/2016, 3:00 PM              Scheduled Meds: . ipratropium-albuterol  3 mL Nebulization TID  . levothyroxine  50 mcg Oral QAC breakfast  . mouth rinse  15 mL Mouth Rinse BID  . pramipexole  0.25 mg Oral QHS  . sodium chloride flush  3 mL Intravenous Q12H   Continuous Infusions:    LOS: 8 days   Merlene Laughtermair Latif Nathanyel Defenbaugh, DO [facility] Pager [phone]  If 7PM-7AM, please contact night-coverage [email or web address] Password [email or web address] 05/06/2016, 3:42 PM

## 2016-05-06 NOTE — Care Management Important Message (Signed)
Important Message  Patient Details  Name: Kathrin RuddyJackie O Bukowski MRN: [id number] Date of Birth: 01/12/1930   Medicare Important Message Given:  Yes    Kyla BalzarineShealy, Ulani Degrasse Abena 05/06/2016, 12:37 PM

## 2016-05-06 NOTE — Consult Note (Signed)
[facility] Liaison  Received request from CSW Brimhall NizhoniNadia for family interest in [facility]. Chart reviewed, attempted contact with daughter Elnita MaxwellCheryl and son at number provided by CSW. [facility] liaison will follow up with family and CSW in am.  Thank you,  Forrestine Himva Davis, LCSW [phone]

## 2016-05-06 NOTE — Progress Notes (Signed)
  Echocardiogram 2D Echocardiogram has been performed.  Delcie RochENNINGTON, Jizel Cheeks 05/06/2016, 11:25 AM

## 2016-05-06 NOTE — Evaluation (Signed)
Clinical/Bedside Swallow Evaluation Patient Details  Name: Xavier Taylor MRN: [id number] Date of Birth: 1930/05/25  Today's Date: 05/06/2016 Time: SLP Start Time (ACUTE ONLY): 1013 SLP Stop Time (ACUTE ONLY): 1030 SLP Time Calculation (min) (ACUTE ONLY): 17 min  Past Medical History:  Past Medical History:  Diagnosis Date  . A-fib (HCC)   . Acute liver failure 04/28/2016  . Alcoholic liver disease (HCC) 04/28/2016  . Ascites   . BPH (benign prostatic hypertrophy)   . CAD (coronary artery disease)   . Chronic renal disease, stage III   . COPD (chronic obstructive pulmonary disease) (HCC)   . Coronary artery disease   . Decreased GFR   . GERD with stricture   . Hypertension   . Hypothyroid   . Internal hemorrhoids   . Renal disorder   . S/P CABG x 5   . Thrombocytopenia (HCC) 04/29/2016   Past Surgical History:  Past Surgical History:  Procedure Laterality Date  . APPENDECTOMY    . BACK SURGERY    . CARDIAC SURGERY    . ESOPHAGOGASTRODUODENOSCOPY N/A 04/30/2016   Procedure: ESOPHAGOGASTRODUODENOSCOPY (EGD);  Surgeon: Carman ChingJames Edwards, MD;  [facility];  Service: Endoscopy;  Laterality: N/A;   HPI:  Xavier ReilJackie O Rowlandis an 82 y.o.malewith a PMH of CAD status post CABG, chronic atrial fibrillation on blood thinners and Heavily Alcohol Abuse who was admitted 04/28/16 with a chief complaint of confusion and weakness. He underwent an EGD 04/30/2016. Patient spiked a white count and had imaging showing contracted gallbladder so General Surgery was consulted. Imaging revealed no Acute Cholecystitis, however patient did have a Urine Culture come back with E. Faecalis. Patient complained of midepigastric pain later in the day so an EKG was done and troponins were mildly elevated. Cardiology was consulted. After discussion with son Palliative Care was also consulted because patient has significant Drinking problem. EGD done showed There is no endoscopic evidence of Barrett's  esophagus, esophagitis, inflammation, stricture, ulcerations or varices in the entire esophagus.The stomach was normal.CXR ordered and showed Cardiomegaly with moderate pulmonary vascular congestion bilateral pleural effusions compatible with congestive heart failure. Mild bibasilar airspace disease likely reflects atelectasis. Infection is not excluded.   Assessment / Plan / Recommendation Clinical Impression  Pt demonstrates signs of oral and oropharyngeal and possible esophageal dysphagia with only a few sips of water. Pt orally holds bolus for several second. Initial swallows tolerated well, but signs of aspiration increased with intake. First wet vocal quality, then delayed throat clearing then immediate and delayed coughing. Family at bedside reports pt has been coughing with liquids, grimacing with swallowing. He has consumed very little food since July, only alcoholic drinks. Will f/u with MBS for objective assessment of swallowing given impairment. Family in agreement.     Aspiration Risk  Severe aspiration risk;Risk for inadequate nutrition/hydration    Diet Recommendation NPO   Medication Administration: Whole meds with puree    Other  Recommendations Oral Care Recommendations: Oral care QID   Follow up Recommendations        Frequency and Duration            Prognosis        Swallow Study   General HPI: Xavier ReilJackie O Rowlandis an 82 y.o.malewith a PMH of CAD status post CABG, chronic atrial fibrillation on blood thinners and Heavily Alcohol Abuse who was admitted 04/28/16 with a chief complaint of confusion and weakness. He underwent an EGD 04/30/2016. Patient spiked a white count and had imaging showing contracted  gallbladder so General Surgery was consulted. Imaging revealed no Acute Cholecystitis, however patient did have a Urine Culture come back with E. Faecalis. Patient complained of midepigastric pain later in the day so an EKG was done and troponins were mildly  elevated. Cardiology was consulted. After discussion with son Palliative Care was also consulted because patient has significant Drinking problem. EGD done showed There is no endoscopic evidence of Barrett's esophagus, esophagitis, inflammation, stricture, ulcerations or varices in the entire esophagus.The stomach was normal.CXR ordered and showed Cardiomegaly with moderate pulmonary vascular congestion bilateral pleural effusions compatible with congestive heart failure. Mild bibasilar airspace disease likely reflects atelectasis. Infection is not excluded. Type of Study: Bedside Swallow Evaluation Previous Swallow Assessment: none Diet Prior to this Study: NPO Temperature Spikes Noted: No Respiratory Status: Room air;Nasal cannula (pt not wearning his nasal cannula) History of Recent Intubation: No Behavior/Cognition: Alert;Cooperative Oral Cavity Assessment: Within Functional Limits Oral Care Completed by SLP: No Oral Cavity - Dentition: Dentures, top Vision: Functional for self-feeding Self-Feeding Abilities: Able to feed self Patient Positioning: Upright in bed Baseline Vocal Quality: Normal Volitional Cough: Strong Volitional Swallow: Able to elicit    Oral/Motor/Sensory Function Overall Oral Motor/Sensory Function: Generalized oral weakness   Ice Chips     Thin Liquid Thin Liquid: Impaired Presentation: Cup;Straw;Self Fed Oral Phase Functional Implications: Oral holding Pharyngeal  Phase Impairments: Throat Clearing - Immediate;Cough - Delayed;Throat Clearing - Delayed;Wet Vocal Quality    Nectar Thick Nectar Thick Liquid: Not tested   Honey Thick Honey Thick Liquid: Not tested   Puree Puree: Not tested   Solid   GO   Solid: Not tested       Xavier DittyBonnie Sabir Charters, MA CCC-SLP [phone]  Xavier Taylor, Xavier NearingBonnie Taylor 05/06/2016,10:51 AM

## 2016-05-06 NOTE — Progress Notes (Signed)
Physical Therapy Treatment Patient Details Name: Xavier Taylor MRN: [id number] DOB: 1930-05-29 Today's Date: 05/06/2016    History of Present Illness pt is an 82 y/o male with mh of CAD, s/CABG, Alcohol abuse, COPD, admitted to ED with confusion, fatigue and weakness over the last 1.5 weeks, with difficulty walking.    PT Comments    Pt presents with decline in function from previous session.  Will inform supervising PT to follow up next visit.    Follow Up Recommendations  SNF     Equipment Recommendations  None recommended by PT    Recommendations for Other Services       Precautions / Restrictions Precautions Precautions: Fall Restrictions Weight Bearing Restrictions: No    Mobility  Bed Mobility Overal bed mobility: Needs Assistance Bed Mobility: Supine to Sit;Rolling Rolling: Mod assist;+2 for physical assistance   Supine to sit: Mod assist;+2 for physical assistance     General bed mobility comments: multimodal cues for initiation of and attending to task; cues for sequencing; assist to elevate trunk into sitting and scoot hips to EOB; HOB elevated  Transfers Overall transfer level: Needs assistance Equipment used: Rolling walker (2 wheeled) Transfers: Sit to/from Stand Sit to Stand: Max assist;+2 physical assistance         General transfer comment: Cues for forward weight shifting.  Performed sit to stand with flexed posture and flexed LEs.  Pt required cues for sidestepping to chair.    Ambulation/Gait Ambulation/Gait assistance:  (unable.  )               Stairs            Wheelchair Mobility    Modified Rankin (Stroke Patients Only)       Balance     Sitting balance-Leahy Scale: Fair       Standing balance-Leahy Scale: Poor                      Cognition Arousal/Alertness: Awake/alert Behavior During Therapy: WFL for tasks assessed/performed Overall Cognitive Status: Impaired/Different from baseline                       Exercises General Exercises - Lower Extremity Ankle Circles/Pumps: AROM;Both;15 reps;Supine Quad Sets: AROM;Both;10 reps;Supine    General Comments        Pertinent Vitals/Pain Pain Assessment: No/denies pain    Home Living                      Prior Function            PT Goals (current goals can now be found in the care plan section) Acute Rehab PT Goals Patient Stated Goal: none stated Potential to Achieve Goals: Fair Progress towards PT goals: Not progressing toward goals - comment    Frequency    Min 3X/week      PT Plan Current plan remains appropriate    Co-evaluation             End of Session Equipment Utilized During Treatment: Gait belt Activity Tolerance: Patient tolerated treatment well Patient left: in chair;with call bell/phone within reach;with chair alarm set (waffle cushion under bottom.  )     Time: 4098-1191: 1452-1512 PT Time Calculation (min) (ACUTE ONLY): 20 min  Charges:  $Therapeutic Activity: 8-22 mins                    G Codes:  Lynden Flemmer Artis DelayJ Raeford Brandenburg 05/06/2016, 3:30 PM

## 2016-05-06 NOTE — Progress Notes (Signed)
Nutrition Brief Note  Chart reviewed. Pt now transitioning to comfort care.  No further nutrition interventions warranted at this time.  Please re-consult as needed.   Macio Kissoon A. Gleb Mcguire, RD, LDN, CDE Pager: [phone] After hours Pager: [phone]  

## 2016-05-06 NOTE — Progress Notes (Signed)
CSW sent referral to [facility] for [facility]. Per family, [facility].   [name] LCSWA [phone]

## 2016-05-06 NOTE — Consult Note (Signed)
Consultation Note Date: 05/06/2016   Patient Name: Xavier Taylor  DOB: [birth year]  MRN: [id number]  Age / Sex: 82 y.o., male  PCP: Merlene LaughterHal Stoneking, MD Referring Physician: Merlene Laughtermair Latif Sheikh, DO  Reason for Consultation: Establishing goals of care  HPI/Patient Profile: 82 y.o. male  with past medical history of ETOH abuse, CAD s/p CABG, chronic aFib (anticoag on hold) admitted on 04/28/2016 with weakness and confusion. Workup has thus revealed acute liver failure likely alcoholic induced (MELD 26), FTT with albumin 2.2, and anemia that has been refractory to blood transfusions (Hgb today is 7.5 s/p 2 unit transfusion yesterday; FOBT positive).   Clinical Assessment and Goals of Care: Patient admitted from independent living facility due to weakness and confusion. Family has noted significant decline over the last several months in patient's cognitive, functional and nutritional status. Patient currently is not eating or drinking. He is hallucinating with visual and auditory hallucinations. During this admission he is noted to be agitated at times, pulling at his IV and catheter, requiring mitts. He is incontinent of stool He is unable to stand on his own. He has moments of clarity after blood transfusions but is otherwise altered. Family feels he has been dying for several months and they are clear in their goals for comfort measures only patient going forward. Discussed disease trajectory, dying process, and symptom management. Family has noted patient with SOB, pain at times, and agitation. They are interested in comfort care and possibly residential hospice facility placement. Discussed suspending any further diagnostic interventions and family agrees.  NEXT OF KIN - patient's spouse and children    SUMMARY OF RECOMMENDATIONS -Comfort care -Residential Hospice evaluation -Initiate comfort feeding   Symptom  Management:   -haloperidol .5mg  PO or IV q4 hours for agitation  -morphine concentrate 10mg /.675mL 5mg  q2 hrs prn mod pain or SOB  -lorazepam 1mg  po or IV q4 hours anxiety  Code Status/Advance Care Planning:  DNR    Symptom Management:   As above  Palliative Prophylaxis:   Aspiration and Delirium Protocol  Additional Recommendations (Limitations, Scope, Preferences):  Avoid Hospitalization, Full Comfort Care, Minimize Medications, Initiate Comfort Feeding, No Artificial Feeding, No Blood Transfusions, No Chemotherapy, No Diagnostics, No Glucose Monitoring, No Hemodialysis, No IV Antibiotics, No IV Fluids, No Lab Draws, No Radiation, No Surgical Procedures and No Tracheostomy  Psycho-social/Spiritual:   Desire for further Chaplaincy support:No  Additional Recommendations: Education on Hospice  Prognosis:   < 2 weeks d/t no po intake, FTT, poor functional status, multi organ failure, transition to comfort care  Discharge Planning: Home with Hospice pending Hospice evaluation and bed availability     Primary Diagnoses: Present on Admission: . Atrial fibrillation, chronic (HCC) . CAD (coronary artery disease) . Anemia . Hyponatremia . Hypokalemia . Alcoholic liver disease (HCC) . Hypomagnesemia . COPD (chronic obstructive pulmonary disease) (HCC) . Alcohol abuse . Acute liver failure . Coagulopathy (HCC) . Thrombocytopenia (HCC) . Hypothyroidism . Acute renal failure superimposed on stage 3 chronic kidney disease (HCC)   I have  reviewed the medical record, interviewed the patient and family, and examined the patient. The following aspects are pertinent.  Past Medical History:  Diagnosis Date  . A-fib (HCC)   . Acute liver failure 04/28/2016  . Alcoholic liver disease (HCC) 04/28/2016  . Ascites   . BPH (benign prostatic hypertrophy)   . CAD (coronary artery disease)   . Chronic renal disease, stage III   . COPD (chronic obstructive pulmonary disease) (HCC)    . Coronary artery disease   . Decreased GFR   . GERD with stricture   . Hypertension   . Hypothyroid   . Internal hemorrhoids   . Renal disorder   . S/P CABG x 5   . Thrombocytopenia (HCC) 04/29/2016   Social History   Social History  . Marital status: Married    Spouse name: N/A  . Number of children: 4  . Years of education: N/A   Social History Main Topics  . Smoking status: Former Smoker    Packs/day: 2.00    Years: 40.00    Types: Cigarettes    Quit date: 07/07/1993  . Smokeless tobacco: Former NeurosurgeonUser  . Alcohol use 16.8 oz/week    28 Shots of liquor per week  . Drug use: Unknown  . Sexual activity: Not Asked   Other Topics Concern  . 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 Heart attack Father 3060  . Diabetes Son   . Diabetes Son   . Heart attack Son 9053  . Breast cancer Daughter   . Rheum arthritis Mother    Scheduled Meds: . ipratropium-albuterol  3 mL Nebulization TID  . levothyroxine  50 mcg Oral QAC breakfast  . mouth rinse  15 mL Mouth Rinse BID  . pramipexole  0.25 mg Oral QHS   Continuous Infusions:  PRN Meds:.albuterol, alum & mag hydroxide-simeth, ondansetron **OR** ondansetron (ZOFRAN) IV, RESOURCE THICKENUP CLEAR Medications Prior to Admission:  Prior to Admission medications   Medication Sig Start Date End Date Taking? Authorizing Provider  carvedilol (COREG) 12.5 MG tablet Take 12.5 mg by mouth 2 (two) times daily with a meal.   Yes Historical Provider, MD  ELIQUIS 2.5 MG TABS tablet Take 2.5 mg by mouth daily.  02/21/16  Yes Historical Provider, MD  fluticasone (FLONASE) 50 MCG/ACT nasal spray Place 2 sprays into the nose daily.    Yes Historical Provider, MD  furosemide (LASIX) 40 MG  tablet Take 40 mg by mouth daily.   Yes Historical Provider, MD  levothyroxine (SYNTHROID, LEVOTHROID) 50 MCG tablet Take 50 mcg by mouth daily.   Yes Historical Provider, MD  Multiple Vitamin (MULTIVITAMIN WITH MINERALS) TABS tablet Take 1 tablet by mouth daily.   Yes Historical Provider, MD  omeprazole (PRILOSEC) 20 MG capsule Take 20 mg by mouth daily.    Yes Historical Provider, MD  pramipexole (MIRAPEX) 0.25 MG tablet Take 0.25 mg by mouth at bedtime.   Yes Historical Provider, MD  simvastatin (ZOCOR) 10 MG tablet Take 10 mg by mouth daily at 6 PM.    Yes Historical Provider, MD  Tamsulosin HCl (FLOMAX) 0.4 MG CAPS Take 0.4 mg by mouth daily.   Yes Historical Provider, MD  chlorproMAZINE (  THORAZINE) 25 MG tablet Take 25 mg by mouth 3 (three) times daily as needed for hiccoughs.     Historical Provider, MD  potassium chloride SA (K-DUR,KLOR-CON) 20 MEQ tablet Take 1 tablet (20 mEq total) by mouth 2 (two) times daily. Patient not taking: Reported on 04/28/2016 03/13/16   Elpidio Anis, PA-C   Allergies  Allergen Reactions  . Exelon [Rivastigmine] Other (See Comments)    Didn't work for patient   Review of Systems  Unable to perform ROS: Dementia    Physical Exam  HENT:  Head: Normocephalic and atraumatic.  Eyes: Scleral icterus is present.  Cardiovascular: Intact distal pulses.   irregular  Abdominal: Soft.  Incontinent of stool  Neurological:  Confused, hallucinating  Skin:  Jaundice   Psychiatric:  Disoriented     Vital Signs: BP 109/70 (BP Location: Right Arm)   Pulse 86   Temp 97.4 F (36.3 C) (Oral)   Resp 18   Ht 5\' 8"  (1.727 m)   Wt 70.5 kg (155 lb 6.8 oz)   SpO2 100%   BMI 23.63 kg/m  Pain Assessment: No/denies pain   Pain Score: 1    SpO2: SpO2: 100 % O2 Device:SpO2: 100 % O2 Flow Rate: .O2 Flow Rate (L/min): 3 L/min  IO: Intake/output summary:  Intake/Output Summary (Last 24 hours) at 05/06/16 1445 Last data filed at 05/06/16 1245  Gross per 24  hour  Intake           668.33 ml  Output              750 ml  Net           -81.67 ml    LBM: Last BM Date: 05/05/16 Baseline Weight: Weight: 74.8 kg (165 lb) Most recent weight: Weight: 70.5 kg (155 lb 6.8 oz)     Palliative Assessment/Data: PPS: 10%   Flowsheet Rows   Flowsheet Row Most Recent Value  Intake Tab  Referral Department  Hospitalist  Unit at Time of Referral  Med/Surg Unit  Palliative Care Primary Diagnosis  Cardiac  Date Notified  05/04/16  Palliative Care Type  New Palliative care  Reason for referral  Clarify Goals of Care  Date of Admission  04/28/16  # of days IP prior to Palliative referral  6  Clinical Assessment  Psychosocial & Spiritual Assessment  Palliative Care Outcomes      Time In: 1330  Time Out: 1515 Time Total: 105 minutes  Greater than 50%  of this time was spent counseling and coordinating care related to the above assessment and plan.  Signed by: Ocie Bob, AGNP-C Palliative Medicine    Please contact Palliative Medicine Team phone at [phone] for questions and concerns.  For individual provider: See Loretha Stapler

## 2016-05-06 NOTE — Progress Notes (Signed)
Modified Barium Swallow Progress Note  Patient Details  Name: Xavier Taylor MRN: [id number] Date of Birth: Jan 12, 1930  Today's Date: 05/06/2016  Modified Barium Swallow completed.  Full report located under Chart Review in the Imaging Section.  Brief recommendations include the following:  Clinical Impression  Pt demonstrates a mild oral dysphagia including brief oral holding with most boluses, improved with verbal cues to swallow. The pharyngeal phase is characterized by decreased laryngeal closure with silent penetration and aspiration with thin liquids and mild pooling in the pyriform sinuses. The pt does not sense penetration or trace aspiration, but as aspirate builds he typically responds wth a cough which is not reliably effective to expel aspirate. With thicker textures, nectar and purees, the bolus passes without incident. Esophageal sweep revealed appearance of stasis with no persitalsis (no radiologist to confirm). Pt is recommended to consume a dys 3 (mechanical soft) diet with nectar thick liquids. SLP will f/u for tolerance and education with family.    Swallow Evaluation Recommendations       SLP Diet Recommendations: Dysphagia 3 (Mech soft) solids;Nectar thick liquid   Liquid Administration via: Cup;Straw   Medication Administration: Crushed with puree   Supervision: Patient able to self feed;Full supervision/cueing for compensatory strategies   Compensations: Slow rate;Small sips/bites   Postural Changes: Seated upright at 90 degrees;Remain semi-upright after after feeds/meals (Comment)   Oral Care Recommendations: Oral care BID       Harlon DittyBonnie Ferman Basilio, MA CCC-SLP [phone]  Arieliz Latino, Riley NearingBonnie Caroline 05/06/2016,2:58 PM

## 2016-05-07 DIAGNOSIS — R451 Restlessness and agitation: Secondary | ICD-10-CM

## 2016-05-07 LAB — CULTURE, BLOOD (ROUTINE X 2)
CULTURE: NO GROWTH
Culture: NO GROWTH

## 2016-05-07 NOTE — Care Management Note (Signed)
Case Management Note  Patient Details  Name: Xavier Taylor MRN: [id number] Date of Birth: Dec 06, 1929  Subjective/Objective:                  [facility] shower seat. Grandson lives with her. Has housekeeper and 3 meals a day. ETOH, anemic, r/o varices, getting blood. Per palliative patient close to end of life. Family in agreement for comfort care and transition to hospice. CSW arranging transport to [facility] today.   Action/Plan:   Expected Discharge Date:                  Expected Discharge Plan:  Hospice Medical Facility, [facility]  In-House Referral:  Clinical Social Work  Discharge planning Services  CM Consult  Post Acute Care Choice:  NA Choice offered to:  NA  DME Arranged:  N/A DME Agency:  NA  HH Arranged:  NA HH Agency:  NA  Status of Service:  Completed, signed off  If discussed at [employer] Length of Stay Meetings, dates discussed:    Additional Comments:  Lawerance SabalDebbie Tonea Leiphart, RN 05/07/2016, 11:37 AM

## 2016-05-07 NOTE — Progress Notes (Signed)
SLP Cancellation Note  Patient Details Name: Kathrin RuddyJackie O Wessner MRN: [id number] DOB: [birth year]   Cancelled treatment:       Reason Eval/Treat Not Completed: Other (comment). Pt transitioned to comfort care. Diet liberalized by providers to reflect goals of care. No SLP f/u needed. Will sign off.    Nitara Szczerba, Riley NearingBonnie Caroline 05/07/2016, 8:30 AM

## 2016-05-07 NOTE — Progress Notes (Signed)
Cardiology follow up  Echocardiogram reviewed.  LVEF 65-70% with moderate TR and moderate RV dilation.  Mild troponin elevation likely due to demand ischemia.  No indication for further intervention at this time, especially given the plan to proceed with comfort care.  We will sign off.  Please call with questions or concerns.   Brette Cast C. Duke Salviaandolph, MD, [facility]  05/07/2016 11:13 AM

## 2016-05-07 NOTE — Progress Notes (Signed)
Daily Progress Note   Patient Name: Xavier Taylor       Date: 05/07/2016 DOB: 11/20/1929  Age: 82 y.o. MRN#: [id number] Attending Physician: Narda Bondsalph A Nettey, MD Primary Care Physician: Ginette OttoSTONEKING,HAL THOMAS, MD Admit Date: 04/28/2016  Reason for Consultation/Follow-up: Establishing goals of care  Subjective: Patient in chair at bedside moaning. States "my butt, my butt". He appears agitated and in pain. Nurse reports he requests to get out of bed frequently.  Noted he has been evaluated by Hospice for res hospice bed, awaiting placement. No family present. Review of Systems  Unable to perform ROS: Dementia    Length of Stay: 9  Current Medications: Scheduled Meds:  . ipratropium-albuterol  3 mL Nebulization TID  . levothyroxine  50 mcg Oral QAC breakfast  . mouth rinse  15 mL Mouth Rinse BID  . pramipexole  0.25 mg Oral QHS  . sodium chloride flush  3 mL Intravenous Q12H    Continuous Infusions:    PRN Meds: sodium chloride, acetaminophen **OR** acetaminophen, albuterol, alum & mag hydroxide-simeth, antiseptic oral rinse, diphenhydrAMINE, glycopyrrolate **OR** glycopyrrolate **OR** glycopyrrolate, haloperidol **OR** [DISCONTINUED] haloperidol **OR** haloperidol lactate, LORazepam **OR** [DISCONTINUED] LORazepam **OR** LORazepam, morphine CONCENTRATE **OR** morphine CONCENTRATE, ondansetron **OR** ondansetron (ZOFRAN) IV, ondansetron **OR** [DISCONTINUED] ondansetron (ZOFRAN) IV, polyvinyl alcohol, RESOURCE THICKENUP CLEAR, sodium chloride flush, traZODone  Physical Exam  Constitutional: He appears distressed.  Frail, elderly  Eyes: Scleral icterus is present.  Cardiovascular:  Unable to assess d/t pt agitation  Pulmonary/Chest:  Unable to assess d/t pt agitation    Musculoskeletal:  Generalized weakness  Skin:  jaundice  Psychiatric:  Confused, moaning            Vital Signs: BP 119/64 (BP Location: Left Arm)   Pulse 98   Temp 97.2 F (36.2 C) (Oral)   Resp 17   Ht 5\' 8"  (1.727 m)   Wt 70.5 kg (155 lb 6.8 oz)   SpO2 97%   BMI 23.63 kg/m  SpO2: SpO2: 97 % O2 Device: O2 Device: Not Delivered O2 Flow Rate: O2 Flow Rate (L/min): 3 L/min  Intake/output summary:  Intake/Output Summary (Last 24 hours) at 05/07/16 1336 Last data filed at 05/07/16 1142  Gross per 24 hour  Intake              243 ml  Output              425 ml  Net             -182 ml   LBM: Last BM Date: 05/07/16 Baseline Weight: Weight: 74.8 kg (165 lb) Most recent weight: Weight: 70.5 kg (155 lb 6.8 oz)       Palliative Assessment/Data: PPS: 10%    Flowsheet Rows   Flowsheet Row Most Recent Value  Intake Tab  Referral Department  Hospitalist  Unit at Time of Referral  Med/Surg Unit  Palliative Care Primary Diagnosis  Cardiac  Date Notified  05/04/16  Palliative Care Type  New Palliative care  Reason for referral  Clarify Goals of Care  Date of Admission  04/28/16  # of days IP prior to Palliative referral  6  Clinical Assessment  Psychosocial & Spiritual Assessment  Palliative Care Outcomes      Patient Active Problem List   Diagnosis Date Noted  . Acute confusional state   . Terminal care   . Palliative care by specialist   . Advance care planning   . Abnormal findings on diagnostic imaging of liver   . Acute kidney injury superimposed on chronic kidney disease (HCC)   . Elevated INR   . Elevated LFTs   . Coagulopathy (HCC) 04/29/2016  . Thrombocytopenia (HCC) 04/29/2016  . Hypothyroidism 04/29/2016  . Acute renal failure superimposed on stage 3 chronic kidney disease (HCC) 04/29/2016  . Anemia 04/28/2016  . Hyponatremia 04/28/2016  . Hypokalemia 04/28/2016  . Alcoholic liver disease (HCC) 04/28/2016  . Hypomagnesemia 04/28/2016  . COPD  (chronic obstructive pulmonary disease) (HCC) 04/28/2016  . Alcohol abuse 04/28/2016  . Acute liver failure 04/28/2016  . Asbestosis (HCC) 01/23/2016  . SOB (shortness of breath) 01/23/2016  . CAD (coronary artery disease) 03/29/2015  . Atrial fibrillation, chronic (HCC) 03/29/2015    Palliative Care Assessment & Plan   Patient Profile: 82 y.o. male  with past medical history of ETOH abuse, CAD s/p CABG, chronic aFib (anticoag on hold) admitted on 04/28/2016 with weakness and confusion. Workup has thus revealed acute liver failure likely alcoholic induced (MELD 26), FTT with albumin 2.2, and anemia that has been refractory to blood transfusions (Hgb today is 7.5 s/p 2 unit transfusion yesterday; FOBT positive).   Assessment/Recommendations/Plan   Administer comfort medications as ordered  D/C to residential hospice  Goals of Care and Additional Recommendations:  Limitations on Scope of Treatment: Avoid Hospitalization, Full Comfort Care, Minimize Medications, Initiate Comfort Feeding, No Artificial Feeding, No Blood Transfusions, No Chemotherapy, No Diagnostics, No Glucose Monitoring, No Hemodialysis, No IV Antibiotics, No IV Fluids, No Lab Draws, No Radiation, No Surgical Procedures and No Tracheostomy  Code Status:  DNR  Prognosis:   < 2 weeks d/t hepatic failure, no po intake, significant recent declines in cognition and function  Discharge Planning:  Hospice facility  Care plan was discussed with patient's RN. Encouraged RN to administer comfort medications.  Thank you for allowing the Palliative Medicine Team to assist in the care of this patient.   Time In: 0830 Time Out: 0935 Total Time 65 mins Prolonged Time Billed No      Greater than 50%  of this time was spent counseling and coordinating care related to the above assessment and plan.  Ocie BobKasie Catharina Pica, AGNP-C Palliative Medicine   Please contact Palliative Medicine Team phone at [phone] for questions and  concerns.

## 2016-05-07 NOTE — Discharge Summary (Addendum)
Physician Discharge Summary  Xavier Taylor YNW:[id number] DOB: 1930/05/06 DOA: 04/28/2016  PCP: Ginette Otto, MD  Admit date: 04/28/2016 Discharge date: 05/07/2016  Admitted From: Home Disposition:  Hospice  Recommendations for Outpatient Follow-up:  1. Comfort care  Discharge Condition: Hospice CODE STATUS: Comfort care    Brief/Interim Summary:  HPI written by Dr. Therisa Doyne on 04/28/2016  Xavier Taylor is a 82 y.o. male with medical history significant of CAD sp CABG, Alcohol abuse,      Presented with feeling fatigued family was concerned because patient has been, and more pale. They brought him in for further evaluation. Daughter reports that patient has been drinking heavily for the past 50 years she drinks about 4 cups of Southern comfort a day. Family have taking away his car keys but a in exchange agreed to by off a whole for him. They attempted to decrease his alcohol intake by half but he developed tremors and had to stop. Family and patient endorses easy bruising. They have care provider who reported blood in stool. Patient states he is interested currently in quitting drinking. He denies any leg swelling. No hematoma emesis no hematemesis   Patient endorses chronic diarrhea that has been worse lately. Denies any fevers or chills no cough no shortness of breath or chest pain  Regarding pertinent Chronic problems: Echocardiography in 2014 demonstrated a preserved ejection fraction. He has had atrial fibrillation with previous cardioversion but he has apparently been in chronic atrial fibrillation for a couple of years currently on anticoagulation Patient denies hx of seizures  IN ER:  Temp (24hrs), Avg:97.4 F (36.3 C), Min:97.4 F (36.3 C), Max:97.4 F (36.3 C)     RR 22 oxygen saturation 99% HR 76 BP 109/69   Sodium 125 K3.4 BUN 27 creatinine 3.0 up from baseline 1.70  On 15th of October  PLt 115.  WBC 7.5 Hg 7.1 down from 8.7 on 15th Elevated  LFTs total bili 5.1 Ammonia 48 INR 2.9 CT head nonacute KUB ? enteritis   Hospital course:  Anemia of chronic blood loss Secondary to presumed GI bleed. Likely made worse with patient's liver disease. EGD performed which was not significant for source of bleeding.   Enterococcus Faecalis UTI Leukocytosis Patient's urinalysis significant for bilirubinuria and minimal evidence of infection. Urine culture significant for enterococcus faecalis. Since patient was confused, he was treated for UTI. He was initially treated with Zosyn which was switched to Augmentin and discontinued.   Alcoholic liver disease Acute liver failure with confusion and weakness Patient with elevated LFTs, low protein, low albumin, thrombocytopenia and elevated ammonia which improved. CT scan significant for fatty infiltration of the liver with an associated ill-defined hypodensity in right upper lobe of liver; MRI confirmed likely benign cyst.   Chest discomfort Patient with significant history of CAD s/p CABG. Troponin was trended and stable. Cardiology consulted. Echo performed and significant for EF of 65-70%. No further recommendations secondary to change in patient's goals of care  Acute on chronic renal failure Chronic kidney disease, stage III Creatinine continued to worsen. Likely secondary to dehydration. Fluid resuscitation was done cautiously secondary to liver failure.  Coagulopathy Thrombocytopenia INR initially elevated but improved. Thromboyctopenia improved.  Coronary artery disease Cardiology consulted for chest pain management  Atrial fibrillation, chronic Anticoagulation discontinued since patient no longer an anticoagulation candidate secondary to comorbidity, mainly severe liver disease and now, transition to comfort care.  Hypothyroidism Home synthroid restarted. TSH elevated likely secondary to non-adherence  COPD Albuterol and  DuoNeb nebulizer  Alcohol abuse Patient initiated on  CIWA. Thiamine, multivitamin and folic acid started.  Chest pain ACS rule out significant for slight troponin bump. Echo without wall motion abnormalities. Cardiology consulted and agrees troponin leak likely secondary to demand ischemia. Chest pain secondary to non-cardiac cause.  Discharge Diagnoses:  Principal Problem:   Alcoholic liver disease (HCC) Active Problems:   CAD (coronary artery disease)   Atrial fibrillation, chronic (HCC)   Anemia   Hyponatremia   Hypokalemia   Hypomagnesemia   COPD (chronic obstructive pulmonary disease) (HCC)   Alcohol abuse   Acute liver failure   Coagulopathy (HCC)   Thrombocytopenia (HCC)   Hypothyroidism   Acute renal failure superimposed on stage 3 chronic kidney disease (HCC)   Acute confusional state   Terminal care   Palliative care by specialist   Advance care planning   Abnormal findings on diagnostic imaging of liver   Acute kidney injury superimposed on chronic kidney disease (HCC)   Elevated INR   Elevated LFTs    Discharge Instructions  Discharge Instructions    Diet - low sodium heart healthy    Complete by:  As directed    Increase activity slowly    Complete by:  As directed        Medication List    STOP taking these medications   carvedilol 12.5 MG tablet Commonly known as:  COREG   chlorproMAZINE 25 MG tablet Commonly known as:  THORAZINE   ELIQUIS 2.5 MG Tabs tablet Generic drug:  apixaban   fluticasone 50 MCG/ACT nasal spray Commonly known as:  FLONASE   furosemide 40 MG tablet Commonly known as:  LASIX   multivitamin with minerals Tabs tablet   omeprazole 20 MG capsule Commonly known as:  PRILOSEC   potassium chloride SA 20 MEQ tablet Commonly known as:  K-DUR,KLOR-CON   simvastatin 10 MG tablet Commonly known as:  ZOCOR   tamsulosin 0.4 MG Caps capsule Commonly known as:  FLOMAX     TAKE these medications   levothyroxine 50 MCG tablet Commonly known as:  SYNTHROID, LEVOTHROID Take  50 mcg by mouth daily.   pramipexole 0.25 MG tablet Commonly known as:  MIRAPEX Take 0.25 mg by mouth at bedtime.       Allergies  Allergen Reactions  . Exelon [Rivastigmine] Other (See Comments)    Didn't work for patient    Consultations:  Cardiology  Palliative care  Gastroenterology  General surgery   Procedures/Studies: Ct Abdomen Pelvis Wo Contrast  Result Date: 05/02/2016 CLINICAL DATA:  Elevated white blood cell count.  Low hemoglobin. EXAM: CT ABDOMEN AND PELVIS WITHOUT CONTRAST TECHNIQUE: Multidetector CT imaging of the abdomen and pelvis was performed following the standard protocol without IV contrast. COMPARISON:  04/28/2016 FINDINGS: Lower chest: Small right pleural effusion, new since prior study. Bibasilar atelectasis. Heart is enlarged. Prior CABG. Hepatobiliary: Diffuse fatty infiltration of the liver. Low-density area again noted anteriorly within the left lobe, unchanged. No other focal abnormalities visualized. No biliary ductal dilatation. Gallbladder contracted. Mild haziness in the pericholecystic fat again noted, stable. No visible stones. Pancreas: No focal abnormality or ductal dilatation. Spleen: No focal abnormality.  Normal size. Adrenals/Urinary Tract: Continued bladder wall thickening and trabeculation noted, likely related to chronic bladder outlet obstruction. No hydronephrosis. No focal renal abnormality visualized. Adrenal glands unremarkable. Stomach/Bowel: Stomach, large and small bowel grossly unremarkable. Vascular/Lymphatic: Diffuse aortic and iliac calcifications. No aneurysm. Mildly prominent retrocrural and posterior mediastinal lymph nodes adjacent to the esophagus.  Index retrocrural lymph node measures 18 mm in short axis diameter compared with 12 mm previously. No other retroperitoneal or mesenteric adenopathy. Reproductive: Marked enlargement of the prostate. Other: No free fluid or free air. Musculoskeletal: No acute bony abnormality or  focal bone lesion. Degenerative changes in the lumbar spine. IMPRESSION: Small right pleural effusion, new since prior study. Bibasilar atelectasis. Diffuse fatty infiltration of the liver. Continued ill-defined low-density area within the liver in the left hepatic lobe, which cannot be characterized without intravenous contrast. May consider further evaluation with MRI. Continued mildly contracted gallbladder with stranding in the pericholecystic fat. If there is concern for cholecystitis, this would be better evaluated with ultrasound. Prostate enlargement. Bladder wall thickening likely related to chronic bladder outlet obstruction. Aortoiliac atherosclerosis. Electronically Signed   By: Charlett Nose M.D.   On: 05/02/2016 12:13   Ct Abdomen Pelvis Wo Contrast  Result Date: 04/28/2016 CLINICAL DATA:  82 year old male with generalized weakness and fall. Jaundice. EXAM: CT ABDOMEN AND PELVIS WITHOUT CONTRAST TECHNIQUE: Multidetector CT imaging of the abdomen and pelvis was performed following the standard protocol without IV contrast. COMPARISON:  Abdominal radiograph dated 04/28/2016 FINDINGS: Evaluation of this exam is limited in the absence of intravenous contrast. Lower chest: Minimal hazy density in the lingula, likely atelectatic changes. The visualized lung bases are otherwise clear. There is moderate cardiomegaly with coronary vascular calcification. There is hypoattenuation of the cardiac blood pool suggestive of a degree of anemia. Clinical correlation is recommended. No intra-abdominal free air or free fluid. Hepatobiliary: Diffuse fatty infiltration of the liver. A 1 cm ill-defined hypodensity in the left lobe of the liver as well as an ill-defined hypodensity in the right lobe of the liver (series 201 image 22) are indeterminate. Although this may be related to a benign etiology underlying neoplastic process or metastatic disease are not excluded. MRI without and with contrast may provide better  evaluation. No intrahepatic biliary ductal dilatation. The gallbladder is predominantly contracted. No calcified gallstone identified. There is mild haziness of the pericholecystic fat. Ultrasound may provide better evaluation of the gallbladder. Pancreas: Unremarkable. No pancreatic ductal dilatation or surrounding inflammatory changes. Spleen: Normal in size without focal abnormality. Adrenals/Urinary Tract: The adrenal glands appear unremarkable. Mild bilateral renal atrophy. There is no hydronephrosis or nephrolithiasis on either side. A 2 cm left renal upper pole hypodense lesion is not well characterized but demonstrates fluid attenuation and most likely represents a cyst. Ultrasound or MRI may provide better characterisation. The visualized ureters appear unremarkable. There is mild thickening and trabecular appearance of the bladder wall, likely related chronic bladder outlet obstruction. Correlation with urinalysis recommended to exclude UTI. Stomach/Bowel: Oral contrast opacifies the stomach and multiple loops of small bowel traversing into the colon. There is thickened appearance of the gastric wall, likely related to underdistention. Gastritis is less likely. Mild thickening of the jejunal folds in the left hemi abdomen may be related to underdistention. Clinical correlation is recommended to evaluate for enteritis. No evidence of bowel obstruction. Appendectomy. Vascular/Lymphatic: Advanced aortoiliac atherosclerotic disease. Evaluation of the aorta and IVC is limited on this noncontrast study. No portal venous gas identified. There is no adenopathy. Reproductive: The prostate gland is enlarged and heterogeneous with median lobe hypertrophy indenting the base of the bladder. The prostate measures approximately 6.5 cm in transverse diameter. Other: None Musculoskeletal: There is osteopenia with degenerative changes of the spine. Multiple old left posterior rib fractures. No acute fracture. IMPRESSION:  Thickened appearance of the stomach and jejunal folds may  be related to underdistention or represent gastroenteritis. Clinical correlation is recommended. No bowel obstruction. Minimal pericholecystic haziness. Ultrasound may provide better evaluation of the gallbladder. **An incidental finding of potential clinical significance has been found. Ill-defined hepatic hypodense lesions are not characterized. Nonemergent MRI without and with contrast is recommended for further characterization.** Enlarged prostate gland with evidence of chronic bladder outlet obstruction. Correlation with urinalysis recommended to exclude superimposed UTI. Electronically Signed   By: Elgie Collard M.D.   On: 04/28/2016 23:52   Dg Chest 2 View  Result Date: 05/02/2016 CLINICAL DATA:  Shortness breath.  CABG. EXAM: CHEST  2 VIEW COMPARISON:  CT the chest 12/11/2015 FINDINGS: Heart is enlarged. Median sternotomy for CABG is again noted. Moderate pulmonary vascular congestion is evident. Bilateral pleural effusions and lower lobe airspace disease are noted. The visualized soft tissues and bony thorax are unremarkable. IMPRESSION: 1. Cardiomegaly with moderate pulmonary vascular congestion bilateral pleural effusions compatible with congestive heart failure. 2. Mild bibasilar airspace disease likely reflects atelectasis. Infection is not excluded. Electronically Signed   By: Marin Roberts M.D.   On: 05/02/2016 12:19   Ct Head Wo Contrast  Result Date: 04/28/2016 CLINICAL DATA:  Generalized weakness.  Multiple recent falls. EXAM: CT HEAD WITHOUT CONTRAST TECHNIQUE: Contiguous axial images were obtained from the base of the skull through the vertex without intravenous contrast. COMPARISON:  04/21/2016 FINDINGS: Brain: Moderate cerebral atrophy is unchanged. Periventricular white matter hypodensities are unchanged and nonspecific but compatible with mild chronic small vessel ischemic disease. There is no evidence of acute  cortical infarct, intracranial hemorrhage, mass, midline shift, or extra-axial fluid collection. Vascular: Calcified atherosclerosis at the skullbase. Skull: No fracture. Well-defined 2 cm sclerotic lesion in the right parietal bone adjacent to the sagittal suture is unchanged. Sinuses/Orbits: Unchanged mild right sphenoid sinus mucosal thickening. Clear mastoid air cells. Prior bilateral cataract extraction. Other: None. IMPRESSION: 1. No evidence of acute intracranial abnormality. 2. Mild chronic small vessel ischemic disease and moderate cerebral atrophy. Electronically Signed   By: Sebastian Ache M.D.   On: 04/28/2016 19:10   Ct Head Wo Contrast  Result Date: 04/21/2016 CLINICAL DATA:  82 y/o M; fell from standing position, no loss of consciousness, denies head injury. EXAM: CT HEAD WITHOUT CONTRAST TECHNIQUE: Multidetector CT imaging of the headwas performed following the standard protocol without intravenous contrast. COMPARISON:  03/13/2016 CT head FINDINGS: CT HEAD FINDINGS Brain: No evidence of acute infarction, hemorrhage, hydrocephalus, extra-axial collection or mass lesion/mass effect. Partially empty sella turcica incidentally noted. Mild chronic microvascular ischemic changes and moderate parenchymal volume loss are stable. Vascular: Extensive calcific atherosclerosis of carotid siphons and vertebral arteries. Skull: Stable nonspecific sclerotic focus in the right paramedian parietal bone, question intraosseous meningioma. No skull fracture. Sinuses/Orbits: Right sphenoid sinus mucous retention cyst. Paranasal sinuses are otherwise normally aerated. Normally aerated mastoid air cells. Bilateral intra-ocular lens replacement. Other: None. IMPRESSION: 1. No acute intracranial abnormality is identified. 2. Stable mild chronic microvascular ischemic changes and moderate parenchymal volume loss. Electronically Signed   By: Mitzi Hansen M.D.   On: 04/21/2016 03:31   Mr Abdomen Wo  Contrast  Result Date: 05/01/2016 CLINICAL DATA:  Indeterminate hepatic lesion on ultrasound. EXAM: MRI ABDOMEN WITHOUT CONTRAST TECHNIQUE: Multiplanar multisequence MR imaging was performed without the administration of intravenous contrast. COMPARISON:  Ultrasound 04/29/2016, CT 04/28/2016 FINDINGS: Lower chest:  Lung bases are clear. Hepatobiliary: Loss of signal intensity on opposed phase imaging (series 8) consistent hepatic steatosis. Within the central LEFT hepatic lobe 1.6  cm well-circumscribed lesion bilobed lesion is hyperintense on T2 weighted imaging (image 34 series 4. No IV contrast was administered to evaluate enhancement characteristics. No biliary duct dilatation. Gallbladder has mild wall thickening without distention. Mild pericholecystic fluid. No gallstones evident. Common bile duct normal caliber. Pancreas: Normal pancreatic parenchymal intensity. No ductal dilatation or inflammation. Spleen: Normal spleen. Adrenals/urinary tract: Adrenal glands and kidneys are normal. Stomach/Bowel: Stomach is normal. There is some fluid along the second portion duodenum adjacent the gallbladder fossa. (Image 26, series 5 Vascular/Lymphatic: Abdominal aortic normal caliber. No retroperitoneal periportal lymphadenopathy. Mild venous collaterals in the retrocrural space Musculoskeletal: No aggressive osseous lesion IMPRESSION: 1. While no IV contrast administered, lesion in the central LEFT hepatic lobe has imaging characteristics most consistent with benign cyst. 2. Hepatic steatosis.  No biliary obstruction. 3. Small amount of fluid along the second portion duodenum and mild pericholecystic fluid. Differential would include duodenitis, pancreatitis, or chronic cholecystitis. Pancreas does not appear inflamed. Electronically Signed   By: Genevive Bi M.D.   On: 05/01/2016 10:03   US Abdomen Complete  Result Date: 05/03/2016 CLINICAL DATA:  Contracted gallbladder on CT, possible cholecystitis EXAM:  ABDOMEN ULTRASOUND COMPLETE COMPARISON:  CT abdomen/pelvis dated 05/02/2016. MRI abdomen dated 05/01/2016. Right upper quadrant ultrasound dated 04/29/2016. CT abdomen/pelvis dated 04/28/2016. FINDINGS: Gallbladder: Mild gallbladder wall thickening. No gallstones, pericholecystic fluid, or sonographic Murphy's sign. Common bile duct: Diameter: 6 mm Liver: Hyperechoic hepatic parenchyma with coarse echotexture. Predominantly hypoechoic lesion with increased through transmission in the central liver (image 17), measuring 1.9 x 1.5 x 1.8 cm. IVC: No abnormality visualized. Pancreas: Visualized portion unremarkable. Spleen: Size and appearance within normal limits. Right Kidney: Length: 11.3 cm.  No mass or hydronephrosis. Left Kidney: Length: 10.7 cm. 2.3 x 2.6 x 2.2 cm upper pole renal cyst. No hydronephrosis. Abdominal aorta: No aneurysm visualized. Other findings: None. IMPRESSION: Mild gallbladder wall thickening, without associated sonographic findings to suggest acute cholecystitis. 1.9 cm lesion in the central liver, better characterized as a cyst on MRI. Suspected hepatic steatosis. 2.6 cm left upper pole renal cyst. Electronically Signed   By: Charline Bills M.D.   On: 05/03/2016 11:24   Dg Chest Port 1 View  Result Date: 05/06/2016 CLINICAL DATA:  Shortness of breath, hypertension EXAM: PORTABLE CHEST 1 VIEW COMPARISON:  05/02/2016 FINDINGS: Cardiomegaly with vascular congestion. Prior CABG. Bibasilar atelectasis. No overt edema. Suspect small effusions, similar to prior study. No acute bony abnormality. IMPRESSION: Cardiomegaly with vascular congestion and bibasilar atelectasis. Suspect small effusions. Electronically Signed   By: Charlett Nose M.D.   On: 05/06/2016 08:41   Dg Abd Portable 1 View  Result Date: 04/28/2016 CLINICAL DATA:  Generalized weakness. EXAM: PORTABLE ABDOMEN - 1 VIEW COMPARISON:  CT 12/11/2015. FINDINGS: Soft tissue structures are unremarkable. No bowel distention.  Nondistended air-filled loops of small and large bowel are noted. Mild thickening of small-bowel loops noted left upper quadrant. Process such enteritis cannot be excluded. Aortoiliac atherosclerotic vascular calcification. IMPRESSION: 1. Mild thickening of left upper quadrant proximal small bowel loops cannot be excluded. A process such as enteritis cannot be completely excluded. Exam otherwise unremarkable. No evidence of free air. 2. Aortoiliac atherosclerotic vascular disease . Electronically Signed   By: Maisie Fus  Register   On: 04/28/2016 17:14   Dg Swallowing Func-speech Pathology  Result Date: 05/06/2016 Objective Swallowing Evaluation: Type of Study: MBS-Modified Barium Swallow Study Patient Details Name: ACHILLE XIANG MRN: [id number] Date of Birth: 1929/07/12 Today's Date: 05/06/2016 Time: SLP Start Time (ACUTE  ONLY): 1420-SLP Stop Time (ACUTE ONLY): 1436 SLP Time Calculation (min) (ACUTE ONLY): 16 min Past Medical History: Past Medical History: Diagnosis Date . A-fib (HCC)  . Acute liver failure 04/28/2016 . Alcoholic liver disease (HCC) 04/28/2016 . Ascites  . BPH (benign prostatic hypertrophy)  . CAD (coronary artery disease)  . Chronic renal disease, stage III  . COPD (chronic obstructive pulmonary disease) (HCC)  . Coronary artery disease  . Decreased GFR  . GERD with stricture  . Hypertension  . Hypothyroid  . Internal hemorrhoids  . Renal disorder  . S/P CABG x 5  . Thrombocytopenia (HCC) 04/29/2016 Past Surgical History: Past Surgical History: Procedure Laterality Date . APPENDECTOMY   . BACK SURGERY   . CARDIAC SURGERY   . ESOPHAGOGASTRODUODENOSCOPY N/A 04/30/2016  Procedure: ESOPHAGOGASTRODUODENOSCOPY (EGD);  Surgeon: Carman Ching, MD;  Location[facility] ENDOSCOPY;  Service: Endoscopy;  Laterality: N/A; HPI: Xavier Flatt Rowlandis an 82 y.o.malewith a PMH of CAD status post CABG, chronic atrial fibrillation on blood thinners and Heavily Alcohol Abuse who was admitted 04/28/16 with a chief  complaint of confusion and weakness. He underwent an EGD 04/30/2016. Patient spiked a white count and had imaging showing contracted gallbladder so General Surgery was consulted. Imaging revealed no Acute Cholecystitis, however patient did have a Urine Culture come back with E. Faecalis. Patient complained of midepigastric pain later in the day so an EKG was done and troponins were mildly elevated. Cardiology was consulted. After discussion with son Palliative Care was also consulted because patient has significant Drinking problem. EGD done showed There is no endoscopic evidence of Barrett's esophagus, esophagitis, inflammation, stricture, ulcerations or varices in the entire esophagus.The stomach was normal.CXR ordered and showed Cardiomegaly with moderate pulmonary vascular congestion bilateral pleural effusions compatible with congestive heart failure. Mild bibasilar airspace disease likely reflects atelectasis. Infection is not excluded. No Data Recorded Assessment / Plan / Recommendation CHL IP CLINICAL IMPRESSIONS 05/06/2016 Therapy Diagnosis Moderate pharyngeal phase dysphagia Clinical Impression Pt demonstrates a mild oral dysphagia including brief oral holding with most boluses, improved with verbal cues to swallow. The pharyngeal phase is characterized by decreased laryngeal closure with silent penetration and aspiration with thin liquids and mild pooling in the pyriform sinuses. The pt does not sense penetraiton or trace aspriation, but as aspirate builds he typically responds wth a cough which is not reliabley effective to expel aspirate. With thicker textures, nectar and purees, the bolus passes without incident. Esophageal sweep revealed appearance of stasis with no persitalsis (no radiologist to confirm). Pt is recommended to consume a dys 3 (mechanical soft) diet with nectar thick liquids. SLP will f/u for tolerance and education with family.  Impact on safety and function Moderate aspiration  risk;Risk for inadequate nutrition/hydration   CHL IP TREATMENT RECOMMENDATION 05/06/2016 Treatment Recommendations Therapy as outlined in treatment plan below   Prognosis 05/06/2016 Prognosis for Safe Diet Advancement Fair Barriers to Reach Goals -- Barriers/Prognosis Comment -- CHL IP DIET RECOMMENDATION 05/06/2016 SLP Diet Recommendations Dysphagia 3 (Mech soft) solids;Nectar thick liquid Liquid Administration via Cup;Straw Medication Administration Crushed with puree Compensations Slow rate;Small sips/bites Postural Changes Seated upright at 90 degrees;Remain semi-upright after after feeds/meals (Comment)   CHL IP OTHER RECOMMENDATIONS 05/06/2016 Recommended Consults -- Oral Care Recommendations Oral care BID Other Recommendations --   CHL IP FOLLOW UP RECOMMENDATIONS 05/06/2016 Follow up Recommendations Skilled Nursing facility   [facility] IP FREQUENCY AND DURATION 05/06/2016 Speech Therapy Frequency (ACUTE ONLY) min 2x/week Treatment Duration 2 weeks      [facility]  IP ORAL PHASE 05/06/2016 Oral Phase Impaired Oral - Pudding Teaspoon -- Oral - Pudding Cup -- Oral - Honey Teaspoon -- Oral - Honey Cup -- Oral - Nectar Teaspoon -- Oral - Nectar Cup Delayed oral transit;Holding of bolus Oral - Nectar Straw Delayed oral transit;Holding of bolus Oral - Thin Teaspoon -- Oral - Thin Cup Delayed oral transit;Holding of bolus Oral - Thin Straw -- Oral - Puree Delayed oral transit;Holding of bolus Oral - Mech Soft Delayed oral transit;Holding of bolus Oral - Regular -- Oral - Multi-Consistency -- Oral - Pill -- Oral Phase - Comment --  [facility]L IP PHARYNGEAL PHASE 05/06/2016 Pharyngeal Phase Impaired Pharyngeal- Pudding Teaspoon -- Pharyngeal -- Pharyngeal- Pudding Cup -- Pharyngeal -- Pharyngeal- Honey Teaspoon -- Pharyngeal -- Pharyngeal- Honey Cup -- Pharyngeal -- Pharyngeal- Nectar Teaspoon -- Pharyngeal -- Pharyngeal- Nectar Cup WFL Pharyngeal -- Pharyngeal- Nectar Straw WFL Pharyngeal -- Pharyngeal- Thin Teaspoon -- Pharyngeal --  Pharyngeal- Thin Cup Penetration/Aspiration before swallow;Penetration/Aspiration during swallow;Compensatory strategies attempted (with notebox) Pharyngeal Material enters airway, CONTACTS cords and not ejected out;Material enters airway, passes BELOW cords without attempt by patient to eject out (silent aspiration) Pharyngeal- Thin Straw Penetration/Aspiration before swallow;Penetration/Aspiration during swallow;Compensatory strategies attempted (with notebox);Pharyngeal residue - pyriform Pharyngeal Material enters airway, passes BELOW cords without attempt by patient to eject out (silent aspiration) Pharyngeal- Puree WFL Pharyngeal -- Pharyngeal- Mechanical Soft WFL Pharyngeal -- Pharyngeal- Regular -- Pharyngeal -- Pharyngeal- Multi-consistency -- Pharyngeal -- Pharyngeal- Pill -- Pharyngeal -- Pharyngeal Comment --  No flowsheet data found. No flowsheet data found. Harlon Ditty, MA CCC-SLP [phone] Claudine Mouton 05/06/2016, 3:00 PM              US Abdomen Limited Ruq  Result Date: 04/29/2016 CLINICAL DATA:  Elevated LFTs EXAM: US ABDOMEN LIMITED - RIGHT UPPER QUADRANT COMPARISON:  04/28/2016 FINDINGS: Gallbladder: No gallstones are noted within gallbladder. No thickening of gallbladder wall. No sonographic Murphy's sign. Common bile duct: Diameter: 6.2 mm in diameter Liver: There is diffuse increased echogenicity of the liver suspicious for fatty infiltration. Complex cyst in right hepatic lobe measures 1.9 x 1.8 cm. Further correlation with MRI is recommended. No intrahepatic biliary ductal dilatation. IMPRESSION: 1. No gallstones are noted within gallbladder. Diffuse increased echogenicity of the liver suspicious for fatty infiltration. Normal CBD. Complex cyst in right hepatic lobe measures 1.9 cm. Further correlation with MRI is recommended. Electronically Signed   By: Natasha Mead M.D.   On: 04/29/2016 10:10    EGD (04/30/2016) Impression - Normal stomach. - Normal examined  duodenum. - No specimens collected. - Anemia-no etiology evident on endoscopic evaluation   Echocardiogram (05/06/2016) Study Conclusions  - Left ventricle: The cavity size was normal. Wall thickness was   increased in a pattern of mild LVH. Systolic function was   vigorous. The estimated ejection fraction was in the range of 65%   to 70%. Wall motion was normal; there were no regional wall   motion abnormalities. - Aortic valve: Trileaflet; mildly thickened, mildly calcified   leaflets. - Mitral valve: Calcified annulus. There was mild regurgitation. - Left atrium: The atrium was moderately dilated.   Anterior-posterior dimension: 46 mm. - Right ventricle: The cavity size was moderately dilated. Wall   thickness was normal. Systolic function was moderately reduced. - Right atrium: The atrium was moderately dilated. - Tricuspid valve: There was moderate regurgitation.   Subjective: Patient repots wanting to be discharged [from [employer]]. No pain.   Discharge Exam: Vitals:   05/07/16 0813 05/07/16  1139  BP: 122/68 119/64  Pulse: 84 98  Resp: 17   Temp: 97.3 F (36.3 C) 97.2 F (36.2 C)   Vitals:   05/07/16 0604 05/07/16 0813 05/07/16 0826 05/07/16 1139  BP: (!) 128/52 122/68  119/64  Pulse: 68 84  98  Resp: 20 17    Temp: 97.8 F (36.6 C) 97.3 F (36.3 C)  97.2 F (36.2 C)  TempSrc:  Oral  Oral  SpO2: 97% 96% 96% 97%  Weight:      Height:        General: Pt is alert, awake, not in acute distress Cardiovascular: RRR, S1/S2 +, no rubs, no gallops Respiratory: CTA bilaterally, no wheezing, no rhonchi Abdominal: Soft, NT, ND, bowel sounds + Extremities: no edema, no cyanosis    The results of significant diagnostics from this hospitalization (including imaging, microbiology, ancillary and laboratory) are listed below for reference.     Microbiology: Recent Results (from the past 240 hour(s))  Gastrointestinal Panel by PCR , Stool     Status: None   Collection  Time: 04/28/16 11:54 PM  Result Value Ref Range Status   Campylobacter species NOT DETECTED NOT DETECTED Final   Plesimonas shigelloides NOT DETECTED NOT DETECTED Final   Salmonella species NOT DETECTED NOT DETECTED Final   Yersinia enterocolitica NOT DETECTED NOT DETECTED Final   Vibrio species NOT DETECTED NOT DETECTED Final   Vibrio cholerae NOT DETECTED NOT DETECTED Final   Enteroaggregative E coli (EAEC) NOT DETECTED NOT DETECTED Final   Enteropathogenic E coli (EPEC) NOT DETECTED NOT DETECTED Final   Enterotoxigenic E coli (ETEC) NOT DETECTED NOT DETECTED Final   Shiga like toxin producing E coli (STEC) NOT DETECTED NOT DETECTED Final   Shigella/Enteroinvasive E coli (EIEC) NOT DETECTED NOT DETECTED Final   Cryptosporidium NOT DETECTED NOT DETECTED Final   Cyclospora cayetanensis NOT DETECTED NOT DETECTED Final   Entamoeba histolytica NOT DETECTED NOT DETECTED Final   Giardia lamblia NOT DETECTED NOT DETECTED Final   Adenovirus F40/41 NOT DETECTED NOT DETECTED Final   Astrovirus NOT DETECTED NOT DETECTED Final   Norovirus GI/GII NOT DETECTED NOT DETECTED Final   Rotavirus A NOT DETECTED NOT DETECTED Final   Sapovirus (I, II, IV, and V) NOT DETECTED NOT DETECTED Final  Culture, Urine     Status: Abnormal   Collection Time: 05/02/16  7:52 AM  Result Value Ref Range Status   Specimen Description URINE, RANDOM  Final   Special Requests NONE  Final   Culture >=100,000 COLONIES/mL ENTEROCOCCUS FAECALIS (A)  Final   Report Status 05/04/2016 FINAL  Final   Organism ID, Bacteria ENTEROCOCCUS FAECALIS (A)  Final      Susceptibility   Enterococcus faecalis - MIC*    AMPICILLIN <=2 SENSITIVE Sensitive     LEVOFLOXACIN 0.5 SENSITIVE Sensitive     NITROFURANTOIN <=16 SENSITIVE Sensitive     VANCOMYCIN 1 SENSITIVE Sensitive     * >=100,000 COLONIES/mL ENTEROCOCCUS FAECALIS  Culture, blood (Routine X 2) w Reflex to ID Panel     Status: None (Preliminary result)   Collection Time: 05/02/16   8:47 AM  Result Value Ref Range Status   Specimen Description BLOOD LEFT ARM  Final   Special Requests IN PEDIATRIC BOTTLE 1CC  Final   Culture NO GROWTH 4 DAYS  Final   Report Status PENDING  Incomplete  Culture, blood (Routine X 2) w Reflex to ID Panel     Status: None (Preliminary result)   Collection Time: 05/02/16  8:55 AM  Result Value Ref Range Status   Specimen Description BLOOD LEFT ANTECUBITAL  Final   Special Requests IN PEDIATRIC BOTTLE 1CC  Final   Culture NO GROWTH 4 DAYS  Final   Report Status PENDING  Incomplete     Labs: BNP (last 3 results) No results for input(s): BNP in the last 8760 hours. Basic Metabolic Panel:  Recent Labs Lab 05/02/16 0622 05/03/16 [id number] 05/03/16 2314 05/04/16 0606 05/04/16 2042 05/05/16 0509 05/06/16 0418  NA 138 139  --  141  --  143 144  K 5.2* 4.2  --  3.7  --  3.3* 3.8  CL 106 108  --  107  --  109 113*  CO2 21* 22  --  23  --  22 21*  GLUCOSE 121* 110*  --  126*  --  116* 125*  BUN 20 23*  --  31*  --  47* 77*  CREATININE 2.02* 2.22*  --  2.34*  --  2.43* 2.68*  CALCIUM 8.9 8.9  --  8.8*  --  8.5* 8.6*  MG 1.7 1.7  --  1.8  --  2.0 2.1  PHOS 2.9 3.4 4.9*  --  3.8  --  2.8   Liver Function Tests:  Recent Labs Lab 05/02/16 0622 05/03/16 0619 05/04/16 0606 05/05/16 0509 05/06/16 0418  AST 161* 135* 119* 104* 121*  ALT 97* 90* 87* 77* 83*  ALKPHOS 105 104 94 82 80  BILITOT 3.8* 3.5* 3.0* 2.4* 2.7*  PROT 5.7* 5.6* 5.8* 5.2* 5.3*  ALBUMIN 2.9* 2.6* 2.5* 2.2* 2.2*   No results for input(s): LIPASE, AMYLASE in the last 168 hours.  Recent Labs Lab 05/01/16 1046  AMMONIA 74*   CBC:  Recent Labs Lab 05/02/16 0622 05/03/16 [id number] 05/04/16 0606 05/05/16 0509 05/06/16 0026 05/06/16 0418  WBC 13.9* 12.3* 12.7* 11.2*  --  14.8*  NEUTROABS 11.6* 9.7* 10.2* 8.7*  --  11.7*  HGB 7.6* 7.6* 7.5* 6.3* 8.0* 7.5*  HCT 23.3* 23.2* 22.7* 18.9* 23.8* 22.1*  MCV 100.4* 100.0 100.0 101.1*  --  91.7  PLT 148* 152 172 157  --   193   Cardiac Enzymes:  Recent Labs Lab 05/03/16 1820 05/03/16 2314 05/04/16 0606  TROPONINI 0.04* 0.03* 0.05*   BNP: Invalid input(s): POCBNP CBG: No results for input(s): GLUCAP in the last 168 hours. D-Dimer No results for input(s): DDIMER in the last 72 hours. Hgb A1c No results for input(s): HGBA1C in the last 72 hours. Lipid Profile No results for input(s): CHOL, HDL, LDLCALC, TRIG, CHOLHDL, LDLDIRECT in the last 72 hours. Thyroid function studies No results for input(s): TSH, T4TOTAL, T3FREE, THYROIDAB in the last 72 hours.  Invalid input(s): FREET3 Anemia work up No results for input(s): VITAMINB12, FOLATE, FERRITIN, TIBC, IRON, RETICCTPCT in the last 72 hours. Urinalysis    Component Value Date/Time   COLORURINE ORANGE (A) 05/02/2016 0752   APPEARANCEUR CLOUDY (A) 05/02/2016 0752   LABSPEC 1.014 05/02/2016 0752   PHURINE 7.5 05/02/2016 0752   GLUCOSEU NEGATIVE 05/02/2016 0752   HGBUR LARGE (A) 05/02/2016 0752   BILIRUBINUR MODERATE (A) 05/02/2016 0752   KETONESUR 15 (A) 05/02/2016 0752   PROTEINUR 30 (A) 05/02/2016 0752   NITRITE NEGATIVE 05/02/2016 0752   LEUKOCYTESUR TRACE (A) 05/02/2016 0752   Sepsis Labs Invalid input(s): PROCALCITONIN,  WBC,  LACTICIDVEN Microbiology Recent Results (from the past 240 hour(s))  Gastrointestinal Panel by PCR , Stool     Status: None  Collection Time: 04/28/16 11:54 PM  Result Value Ref Range Status   Campylobacter species NOT DETECTED NOT DETECTED Final   Plesimonas shigelloides NOT DETECTED NOT DETECTED Final   Salmonella species NOT DETECTED NOT DETECTED Final   Yersinia enterocolitica NOT DETECTED NOT DETECTED Final   Vibrio species NOT DETECTED NOT DETECTED Final   Vibrio cholerae NOT DETECTED NOT DETECTED Final   Enteroaggregative E coli (EAEC) NOT DETECTED NOT DETECTED Final   Enteropathogenic E coli (EPEC) NOT DETECTED NOT DETECTED Final   Enterotoxigenic E coli (ETEC) NOT DETECTED NOT DETECTED Final    Shiga like toxin producing E coli (STEC) NOT DETECTED NOT DETECTED Final   Shigella/Enteroinvasive E coli (EIEC) NOT DETECTED NOT DETECTED Final   Cryptosporidium NOT DETECTED NOT DETECTED Final   Cyclospora cayetanensis NOT DETECTED NOT DETECTED Final   Entamoeba histolytica NOT DETECTED NOT DETECTED Final   Giardia lamblia NOT DETECTED NOT DETECTED Final   Adenovirus F40/41 NOT DETECTED NOT DETECTED Final   Astrovirus NOT DETECTED NOT DETECTED Final   Norovirus GI/GII NOT DETECTED NOT DETECTED Final   Rotavirus A NOT DETECTED NOT DETECTED Final   Sapovirus (I, II, IV, and V) NOT DETECTED NOT DETECTED Final  Culture, Urine     Status: Abnormal   Collection Time: 05/02/16  7:52 AM  Result Value Ref Range Status   Specimen Description URINE, RANDOM  Final   Special Requests NONE  Final   Culture >=100,000 COLONIES/mL ENTEROCOCCUS FAECALIS (A)  Final   Report Status 05/04/2016 FINAL  Final   Organism ID, Bacteria ENTEROCOCCUS FAECALIS (A)  Final      Susceptibility   Enterococcus faecalis - MIC*    AMPICILLIN <=2 SENSITIVE Sensitive     LEVOFLOXACIN 0.5 SENSITIVE Sensitive     NITROFURANTOIN <=16 SENSITIVE Sensitive     VANCOMYCIN 1 SENSITIVE Sensitive     * >=100,000 COLONIES/mL ENTEROCOCCUS FAECALIS  Culture, blood (Routine X 2) w Reflex to ID Panel     Status: None (Preliminary result)   Collection Time: 05/02/16  8:47 AM  Result Value Ref Range Status   Specimen Description BLOOD LEFT ARM  Final   Special Requests IN PEDIATRIC BOTTLE 1CC  Final   Culture NO GROWTH 4 DAYS  Final   Report Status PENDING  Incomplete  Culture, blood (Routine X 2) w Reflex to ID Panel     Status: None (Preliminary result)   Collection Time: 05/02/16  8:55 AM  Result Value Ref Range Status   Specimen Description BLOOD LEFT ANTECUBITAL  Final   Special Requests IN PEDIATRIC BOTTLE 1CC  Final   Culture NO GROWTH 4 DAYS  Final   Report Status PENDING  Incomplete     Time coordinating discharge:  Over 30 minutes  SIGNED:   Jacquelin Hawkingalph Denelda Akerley, MD [facility] 05/07/2016, 12:00 PM Pager [phone]) [phone]  If 7PM-7AM, please contact night-coverage [email or web address] Password [email or web address]

## 2016-05-07 NOTE — Progress Notes (Addendum)
[facility] Liaison Visit Met with patient family (wife, daughter and son) to confirm interest and explain services.  Family agreeable to transfer today. Registration paperwork completed.  Made CSW Cedric Fishman aware. Dr. Orpah Melter to assume care per family request.  Please fax discharge summary to [phone].  RN please call report to [phone] and may D/C PIV.  Please arrange transport for patient to arrive early afternoon if possible.   Thank you.  Gar Ponto, [facility] Liaison RN [phone]

## 2016-05-07 NOTE — Progress Notes (Signed)
[facility] Anticipated DC date: 05/07/16 Family notified: Elnita Maxwellheryl Transport by: Sharin MonsPTAR   Per MD [facility]. RN, patient, patient's family, and facility notified of DC. Discharge Summary sent to facility. RN given number for report. DC packet on chart. Ambulance transport requested for patient.   CSW signing off.  Cristobal GoldmannNadia Taji Sather, [place] Clinical Social Worker [phone]

## 2016-06-06 DEATH — deceased

## 2016-10-07 NOTE — Progress Notes (Signed)
Pt unable to complete test.  

## 2017-12-20 IMAGING — CT CT HEAD W/O CM
3 of 4 series · 15 of 47 positions shown, 18 images · non-contrast
Comparison: 03/13/2016 CT head

CLINICAL DATA: 86 y/o M; fell from standing position, no loss of
consciousness, denies head injury.

EXAM:
CT HEAD WITHOUT CONTRAST
TECHNIQUE: Multidetector CT imaging of the headwas performed following the
standard protocol without intravenous contrast.

[Series 2: head w/o · axial · non-contrast · 0.46mm/px · z∈[+1155,+1280]mm · 9 of 33 slices shown, 12 images]
[im 4/33  brain]
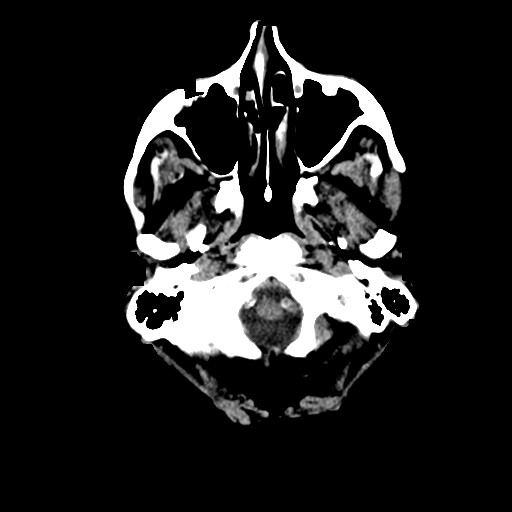
[im 4/33  bone]
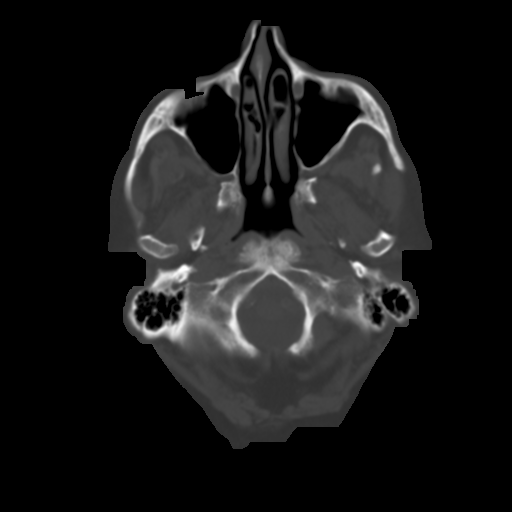
[im 7/33  brain]
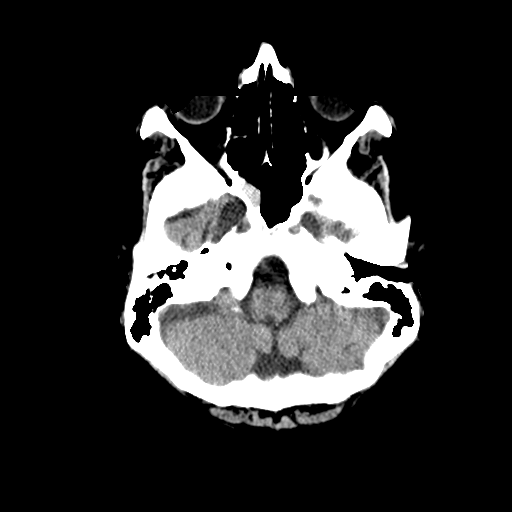
[im 10/33  brain]
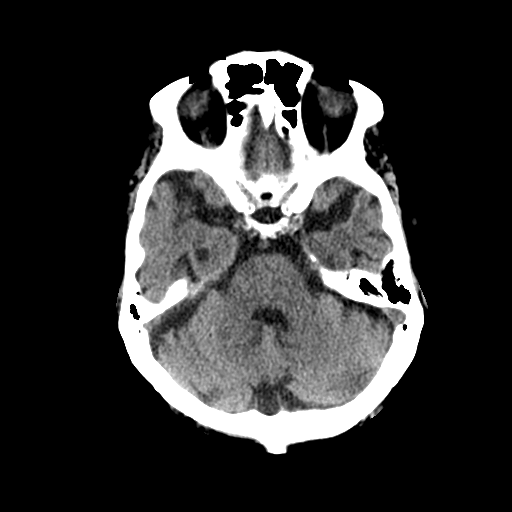
[im 13/33  brain]
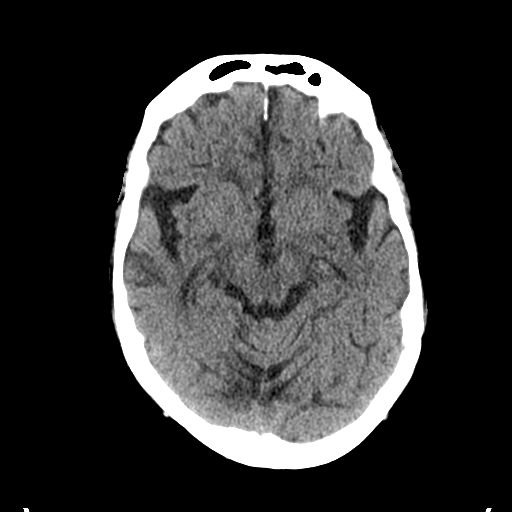
[im 17/33  brain]
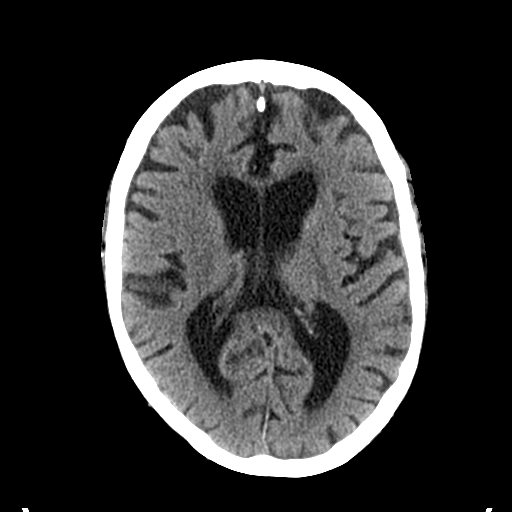
[im 17/33  bone]
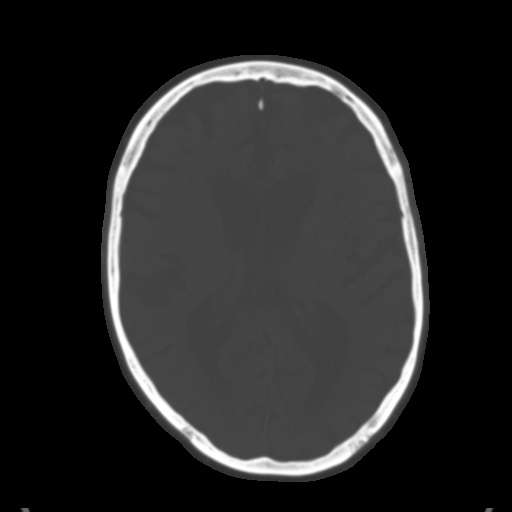
[im 20/33  brain]
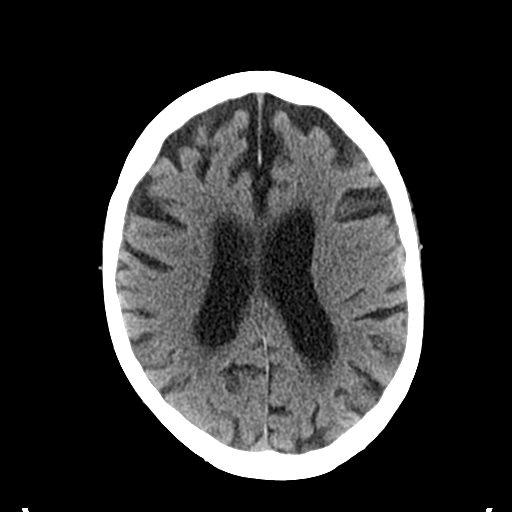
[im 23/33  brain]
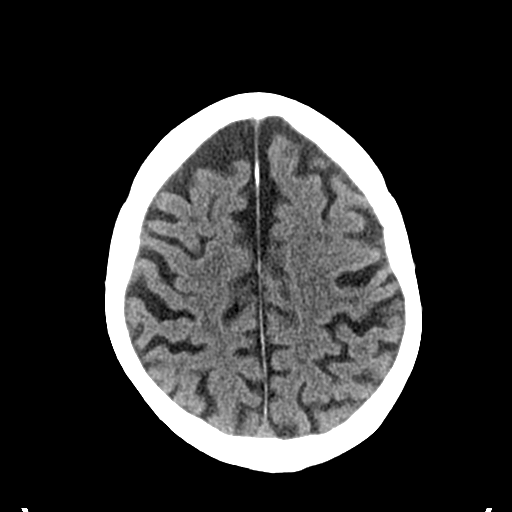
[im 26/33  brain]
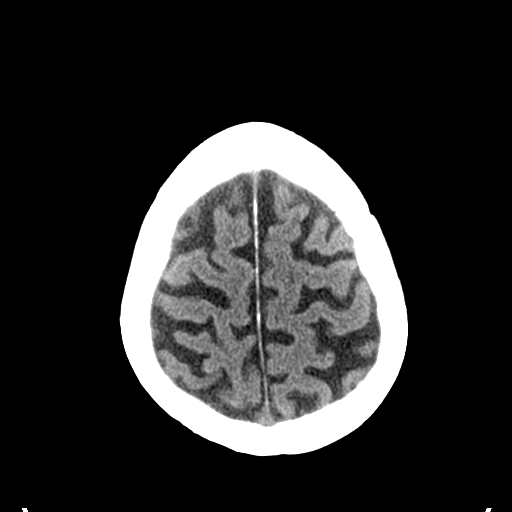
[im 29/33  brain]
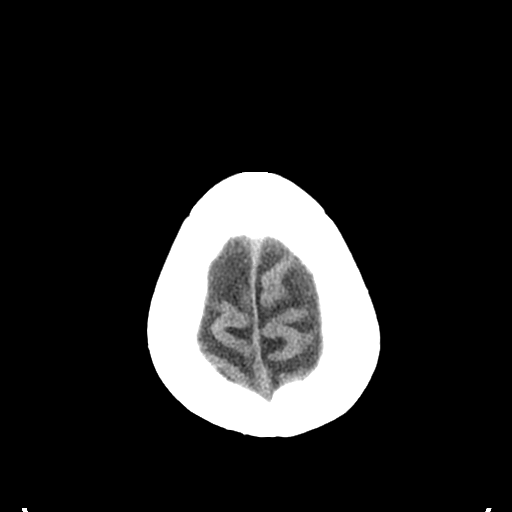
[im 29/33  bone]
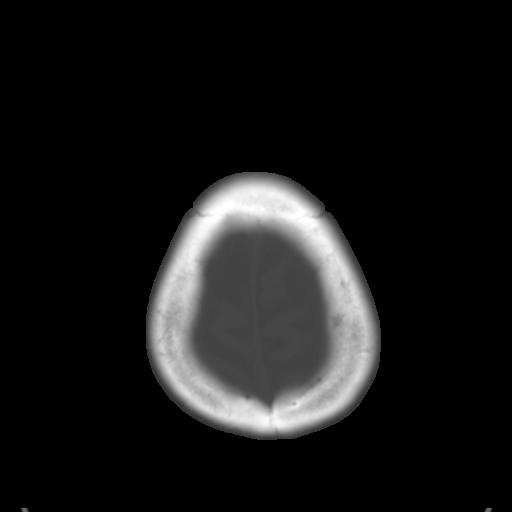

[Series 4: coronal · coronal · 0.30mm/px · 3 of 76 slices shown]
[im 26/76  brain]
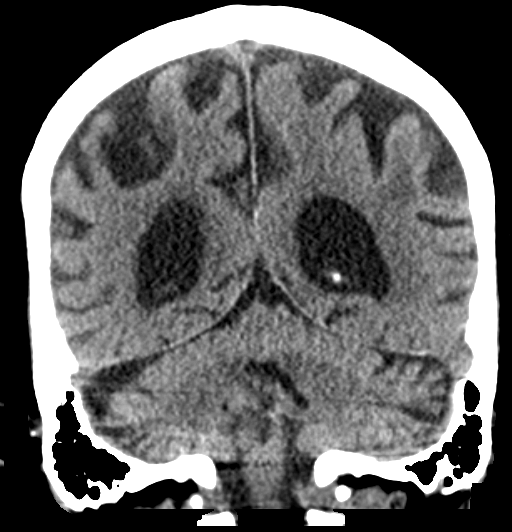
[im 34/76  brain]
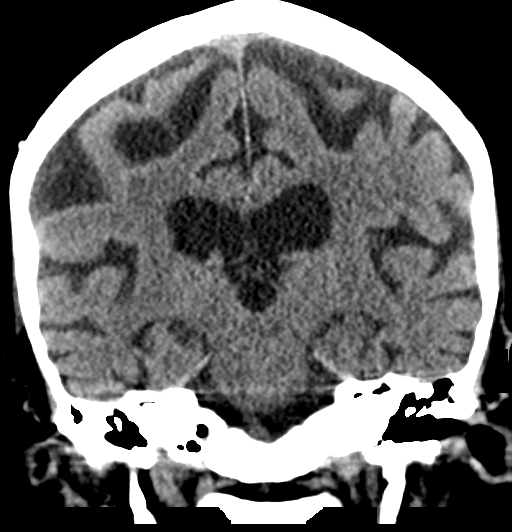
[im 42/76  brain]
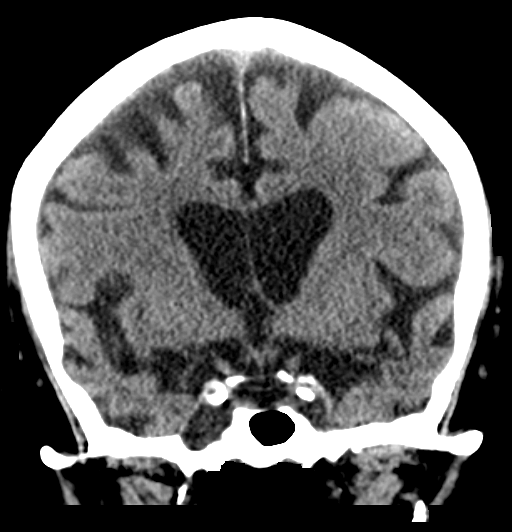

[Series 5: sagittal · sagittal · 0.31mm/px · 3 of 54 slices shown]
[im 18/54  brain]
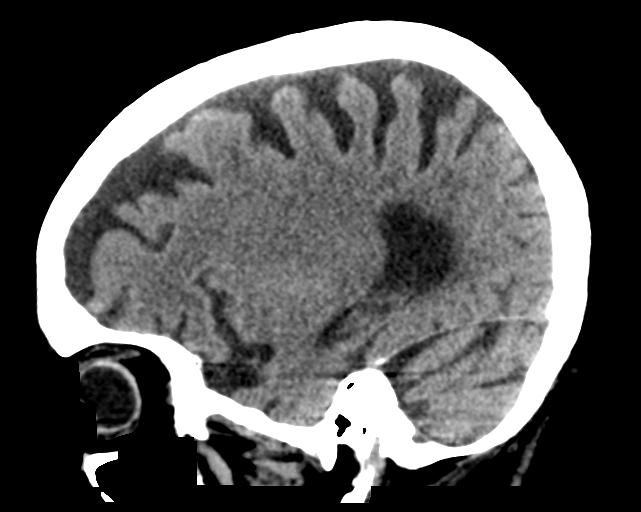
[im 27/54  brain]
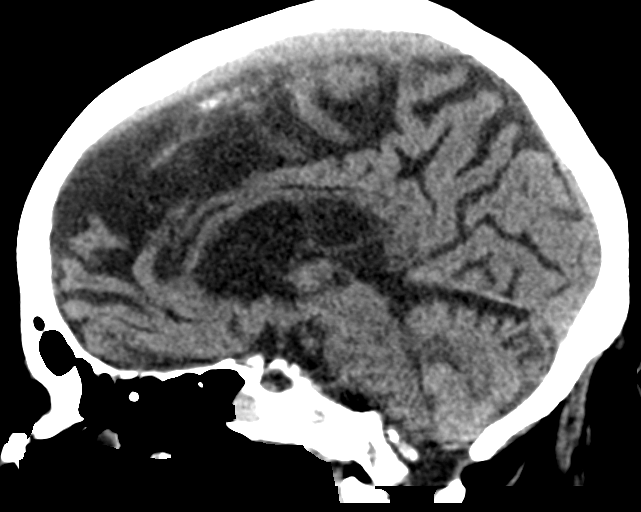
[im 36/54  brain]
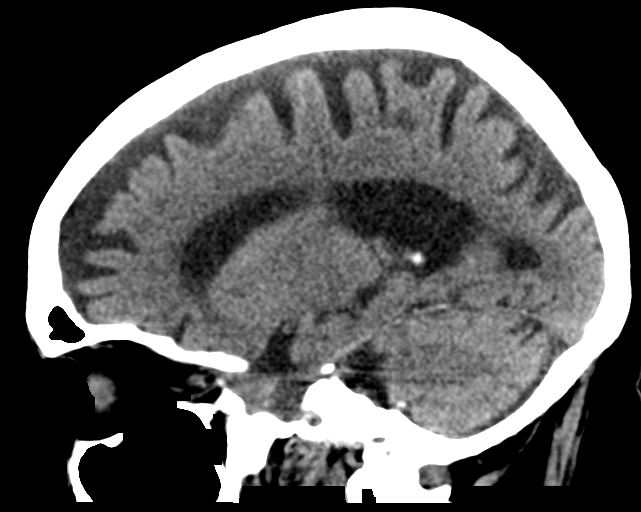

[15 of 47 positions shown; findings below may reference images not displayed]

FINDINGS: CT HEAD FINDINGS

Brain: No evidence of acute infarction, hemorrhage, hydrocephalus,
extra-axial collection or mass lesion/mass effect. Partially empty
sella turcica incidentally noted. Mild chronic microvascular
ischemic changes and moderate parenchymal volume loss are stable.

Vascular: Extensive calcific atherosclerosis of carotid siphons and
vertebral arteries.

Skull: Stable nonspecific sclerotic focus in the right paramedian
parietal bone, question intraosseous meningioma. No skull fracture.

Sinuses/Orbits: Right sphenoid sinus mucous retention cyst.
Paranasal sinuses are otherwise normally aerated. Normally aerated
mastoid air cells. Bilateral intra-ocular lens replacement.

Other: None.
IMPRESSION: 1. No acute intracranial abnormality is identified.
2. Stable mild chronic microvascular ischemic changes and moderate
parenchymal volume loss.

By: Rragweafrica Boeme M.D.
